# Patient Record
Sex: Male | Born: 1965
Health system: Southern US, Community
[De-identification: ages and names within clinical notes are randomized; demographics above are authoritative.]

## PROBLEM LIST (undated history)

## (undated) DIAGNOSIS — H269 Unspecified cataract: Secondary | ICD-10-CM

## (undated) HISTORY — DX: Unspecified cataract: H26.9

---

## 2009-05-29 ENCOUNTER — Inpatient Hospital Stay (HOSPITAL_COMMUNITY): Admission: EM | Admit: 2009-05-29 | Discharge: 2009-05-30 | Payer: Self-pay | Admitting: Emergency Medicine

## 2009-06-11 ENCOUNTER — Encounter: Admission: RE | Admit: 2009-06-11 | Discharge: 2009-06-11 | Payer: Self-pay | Admitting: Orthopedic Surgery

## 2010-08-30 IMAGING — CT CT CERVICAL SPINE W/O CM
2 of 8 series · 6 of 20 positions shown, 7 images · non-contrast
Comparison: None.

CT HEAD

CLINICAL DATA: Fell down stairs.  Laceration to the right side of
the forehead.

CT HEAD WITHOUT CONTRAST
CT CERVICAL SPINE WITHOUT CONTRAST
TECHNIQUE: Multidetector CT imaging of the head and cervical spine
was performed following the standard protocol without intravenous
contrast.  Multiplanar CT image reconstructions of the cervical
spine were also generated.

[Series 106: reformatted · coronal · 0.43mm/px · 3 of 55 slices shown (1 of 2)]
[im 13/55  bone]
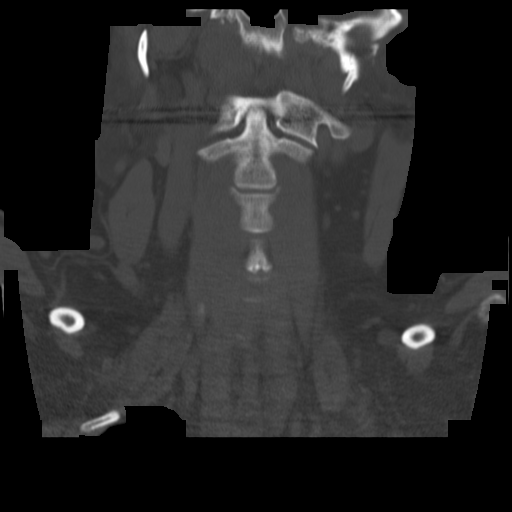
[im 23/55  bone]
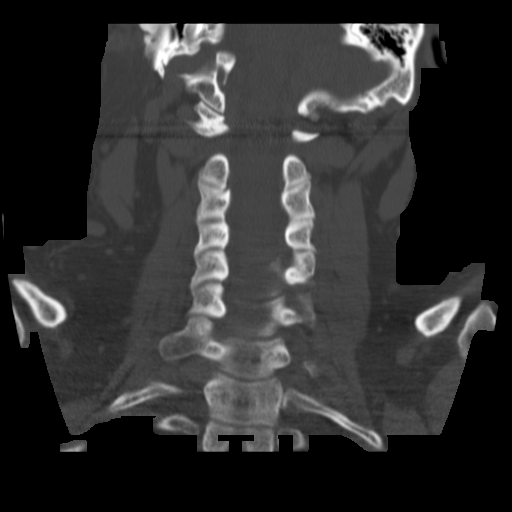
[im 32/55  bone]
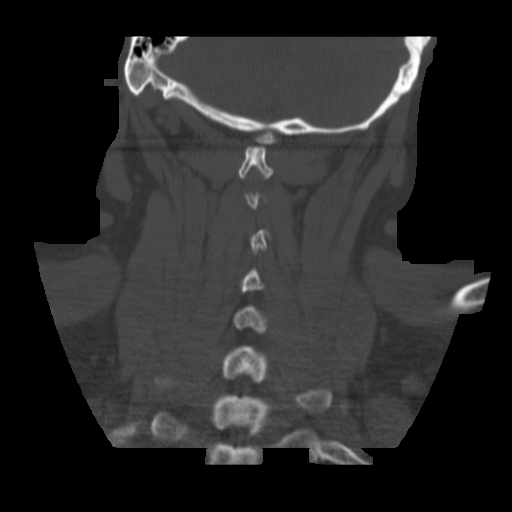

[Series 107: reformatted · axial · 0.43mm/px · z∈[-358,-156]mm · 3 of 82 slices shown, 4 images (2 of 2)]
[im 1/82  soft-tissue]
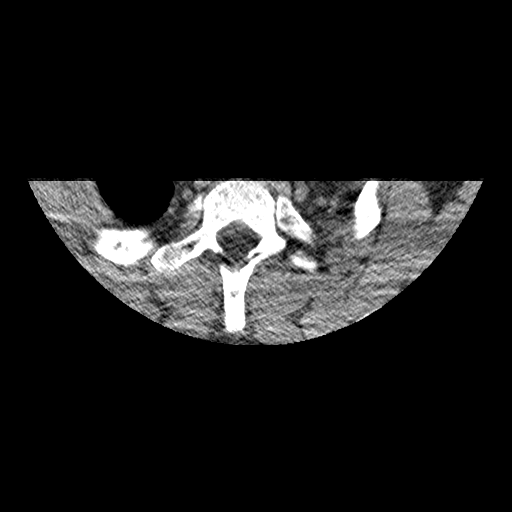
[im 1/82  bone]
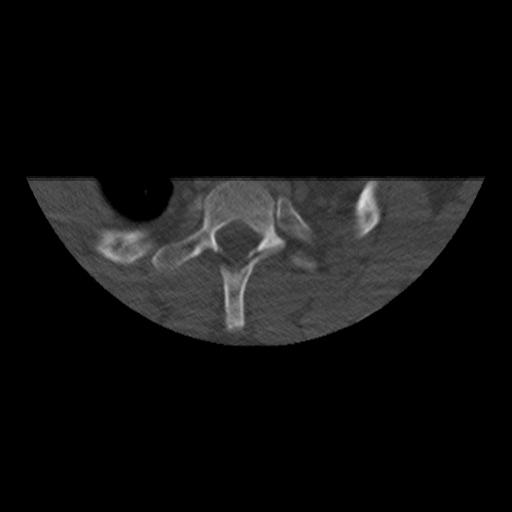
[im 41/82  bone]
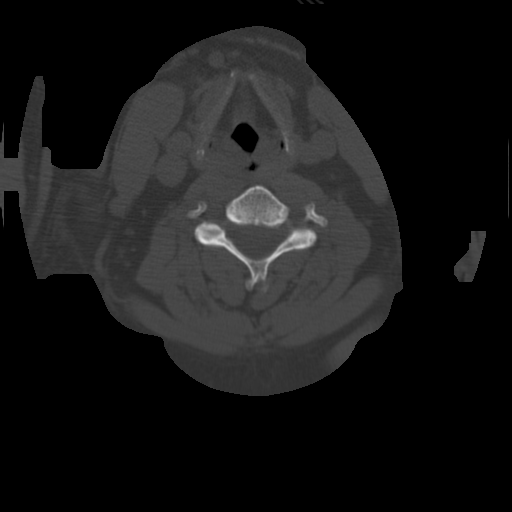
[im 82/82  bone]
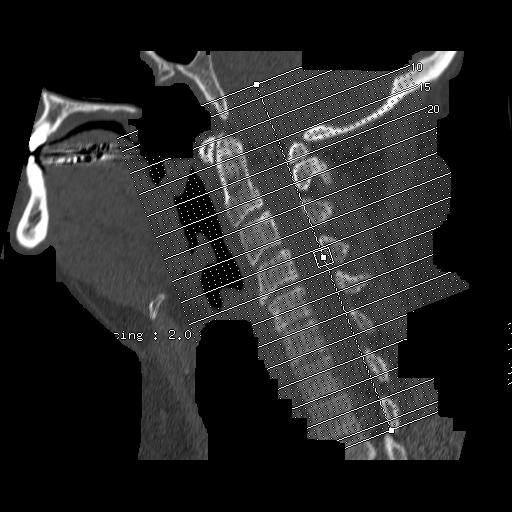

[6 of 20 positions shown; findings below may reference images not displayed]

FINDINGS: Head tilt in the gantry accounts for apparent asymmetry
in the cerebral hemispheres.  Ventricular system normal in size and
appearance for age.  No mass lesion.  No midline shift.  No acute
hemorrhage or hematoma.  No extra-axial fluid collection.  No
evidence of acute infarction.  No focal brain parenchymal
abnormality.

Right frontal scalp hematoma and laceration without underlying
skull fracture.  Visualized paranasal sinuses, mastoid air cells,
and middle ear cavities well-aerated.
IMPRESSION: 1.  Normal intracranially.
2.  Right frontal scalp hematoma without underlying skull fracture.

CT CERVICAL SPINE
FINDINGS: Nondisplaced fracture involving the left lateral mass of
C1.  Sagittal reconstructed images demonstrate anatomic posterior
alignment.  Disc space narrowing and endplate hypertrophic changes
are present at C4-5 and C5-6, mild in degree.  Facet joints intact
throughout.  No spinal stenosis.  Coronal reformatted images
demonstrate intact craniocervical junction, intact C1-C2
accumulation, intact dens, and intact lateral masses.  Right
uncinate hypertrophy accounts for mild right bony foraminal
stenosis at C2-3.  Mild bilateral uncinate hypertrophy accounts for
moderate right and mild left foraminal stenosis at C5-6.
IMPRESSION: 1.  Nondisplaced fracture involving the left lateral mass of C1.
2.  No fractures elsewhere in the cervical spine.
3.  Mild disc space narrowing at C4-5 and C5-6.

Results were telephoned to Dr. Erxleben in the [HOSPITAL]
the time of interpretation on 05/29/2009 at 2132 hours.

## 2010-10-13 LAB — CBC
Hemoglobin: 16.6 g/dL (ref 13.0–17.0)
Platelets: 291 10*3/uL (ref 150–400)
RDW: 13.4 % (ref 11.5–15.5)
WBC: 9.5 10*3/uL (ref 4.0–10.5)

## 2010-10-13 LAB — DIFFERENTIAL
Basophils Absolute: 0.1 10*3/uL (ref 0.0–0.1)
Lymphocytes Relative: 28 % (ref 12–46)
Neutro Abs: 6 10*3/uL (ref 1.7–7.7)

## 2010-10-13 LAB — BASIC METABOLIC PANEL
BUN: 8 mg/dL (ref 6–23)
Calcium: 8.6 mg/dL (ref 8.4–10.5)
GFR calc non Af Amer: >60 mL/min (ref >60)
Glucose, Bld: 156 mg/dL — ABNORMAL HIGH (ref 70–99)
Sodium: 140 mEq/L (ref 135–145)

## 2010-10-13 LAB — ETHANOL: Alcohol, Ethyl (B): 213 mg/dL — ABNORMAL HIGH (ref 0–10)

## 2016-12-28 DIAGNOSIS — R1032 Left lower quadrant pain: Secondary | ICD-10-CM | POA: Diagnosis not present

## 2017-01-05 DIAGNOSIS — Z1211 Encounter for screening for malignant neoplasm of colon: Secondary | ICD-10-CM | POA: Diagnosis not present

## 2017-01-05 DIAGNOSIS — Z01818 Encounter for other preprocedural examination: Secondary | ICD-10-CM | POA: Diagnosis not present

## 2017-01-06 DIAGNOSIS — R740 Nonspecific elevation of levels of transaminase and lactic acid dehydrogenase [LDH]: Secondary | ICD-10-CM | POA: Diagnosis not present

## 2017-01-09 ENCOUNTER — Other Ambulatory Visit: Payer: Self-pay | Admitting: Family Medicine

## 2017-01-09 DIAGNOSIS — R945 Abnormal results of liver function studies: Principal | ICD-10-CM

## 2017-01-09 DIAGNOSIS — R7989 Other specified abnormal findings of blood chemistry: Secondary | ICD-10-CM

## 2017-01-13 DIAGNOSIS — R945 Abnormal results of liver function studies: Secondary | ICD-10-CM | POA: Diagnosis not present

## 2017-01-27 DIAGNOSIS — R7989 Other specified abnormal findings of blood chemistry: Secondary | ICD-10-CM | POA: Diagnosis not present

## 2017-01-27 DIAGNOSIS — R945 Abnormal results of liver function studies: Secondary | ICD-10-CM | POA: Diagnosis not present

## 2017-01-27 DIAGNOSIS — R748 Abnormal levels of other serum enzymes: Secondary | ICD-10-CM | POA: Diagnosis not present

## 2017-02-01 DIAGNOSIS — Z1211 Encounter for screening for malignant neoplasm of colon: Secondary | ICD-10-CM | POA: Diagnosis not present

## 2017-02-01 DIAGNOSIS — D126 Benign neoplasm of colon, unspecified: Secondary | ICD-10-CM | POA: Diagnosis not present

## 2017-02-01 DIAGNOSIS — K635 Polyp of colon: Secondary | ICD-10-CM | POA: Diagnosis not present

## 2017-02-02 DIAGNOSIS — Z1211 Encounter for screening for malignant neoplasm of colon: Secondary | ICD-10-CM | POA: Diagnosis not present

## 2017-02-02 DIAGNOSIS — K635 Polyp of colon: Secondary | ICD-10-CM | POA: Diagnosis not present

## 2017-02-02 DIAGNOSIS — D126 Benign neoplasm of colon, unspecified: Secondary | ICD-10-CM | POA: Diagnosis not present

## 2017-02-06 ENCOUNTER — Ambulatory Visit
Admission: RE | Admit: 2017-02-06 | Discharge: 2017-02-06 | Disposition: A | Discharge status: Home / Self Care | Payer: BLUE CROSS/BLUE SHIELD | Source: Ambulatory Visit | Attending: Family Medicine | Admitting: Family Medicine

## 2017-02-06 DIAGNOSIS — R945 Abnormal results of liver function studies: Secondary | ICD-10-CM | POA: Diagnosis not present

## 2017-02-06 DIAGNOSIS — R7989 Other specified abnormal findings of blood chemistry: Secondary | ICD-10-CM

## 2017-03-31 DIAGNOSIS — E785 Hyperlipidemia, unspecified: Secondary | ICD-10-CM | POA: Diagnosis not present

## 2017-03-31 DIAGNOSIS — Z23 Encounter for immunization: Secondary | ICD-10-CM | POA: Diagnosis not present

## 2017-03-31 DIAGNOSIS — Z720 Tobacco use: Secondary | ICD-10-CM | POA: Diagnosis not present

## 2017-03-31 DIAGNOSIS — Z125 Encounter for screening for malignant neoplasm of prostate: Secondary | ICD-10-CM | POA: Diagnosis not present

## 2017-03-31 DIAGNOSIS — Z0000 Encounter for general adult medical examination without abnormal findings: Secondary | ICD-10-CM | POA: Diagnosis not present

## 2017-06-10 DIAGNOSIS — J069 Acute upper respiratory infection, unspecified: Secondary | ICD-10-CM | POA: Diagnosis not present

## 2017-08-27 DIAGNOSIS — R69 Illness, unspecified: Secondary | ICD-10-CM | POA: Diagnosis not present

## 2017-12-20 IMAGING — US US ABDOMEN COMPLETE
1 series · 13 of 25 positions shown · non-contrast
Comparison: None.

CLINICAL DATA: 51-year-old male with elevated liver function
studies. Left abdominal pain for 2 weeks. Initial encounter.

EXAM:
ABDOMEN ULTRASOUND COMPLETE

[Series 1: us abdomen complete · 0.15mm/px · 13 of 86 slices shown]
[im 1/86]
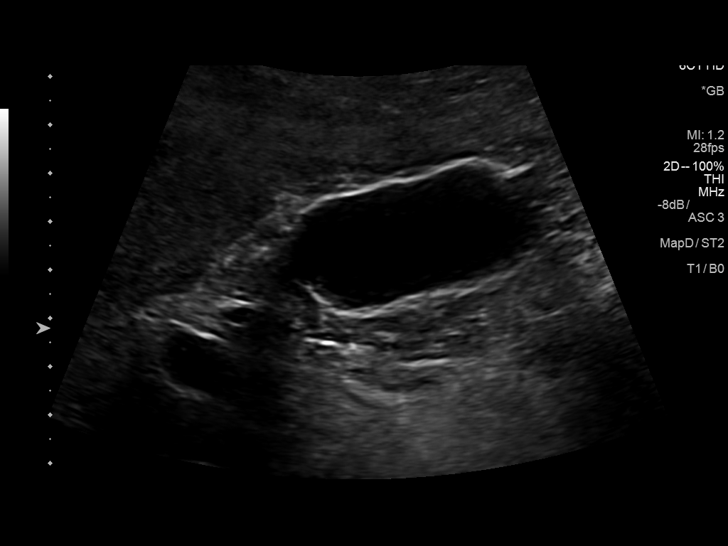
[im 8/86]
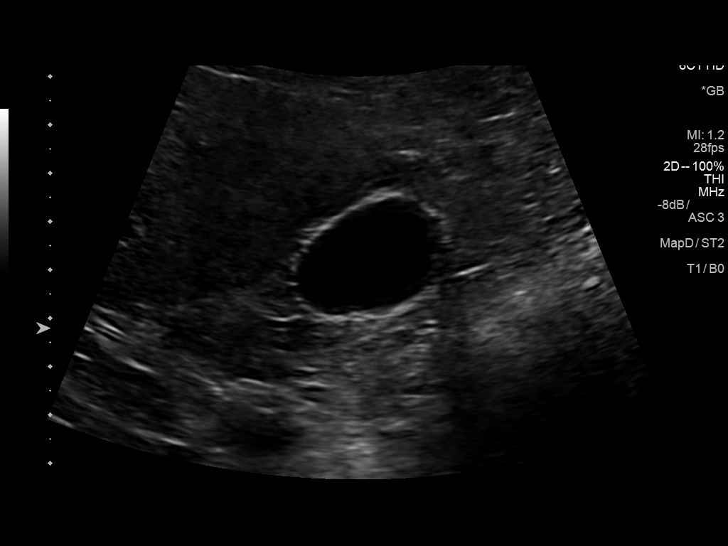
[im 15/86]
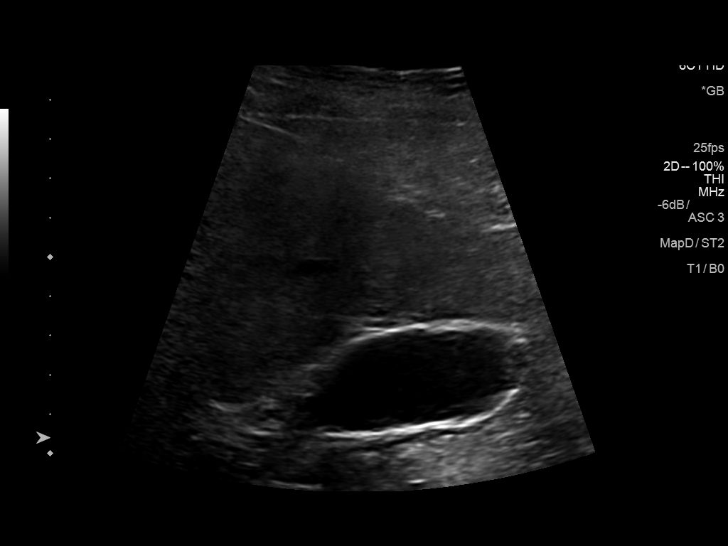
[im 22/86]
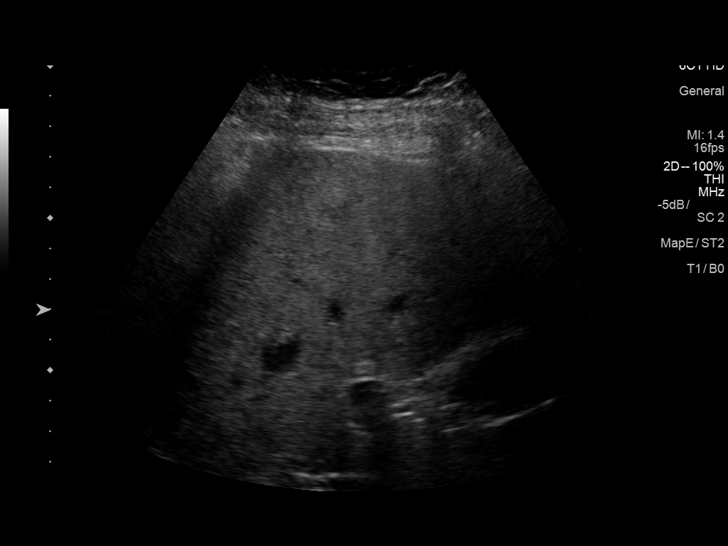
[im 29/86]
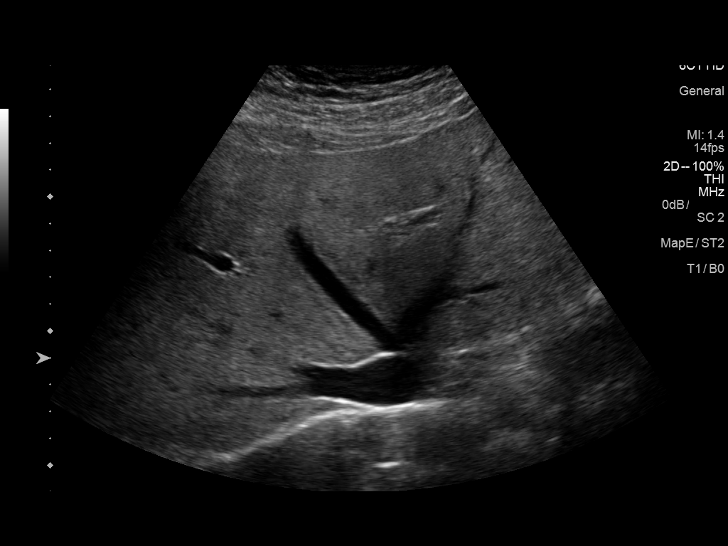
[im 36/86]
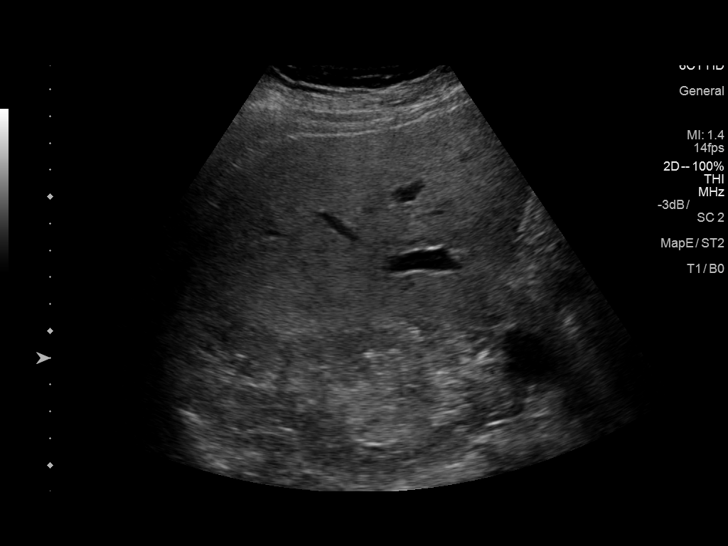
[im 43/86]
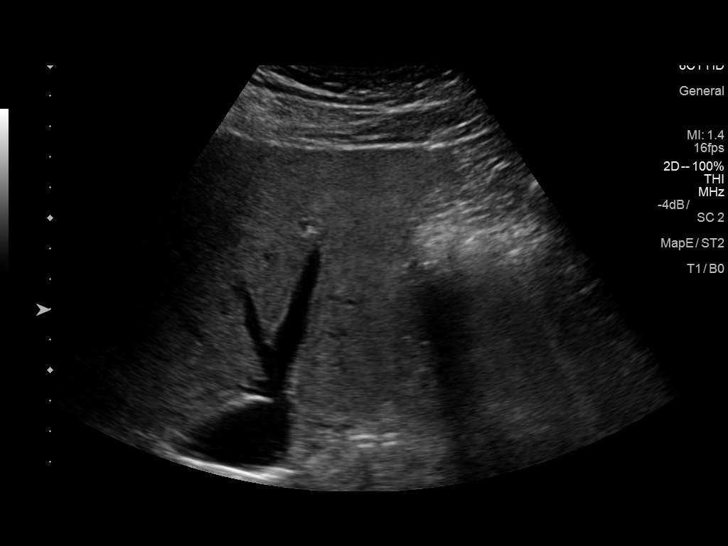
[im 50/86]
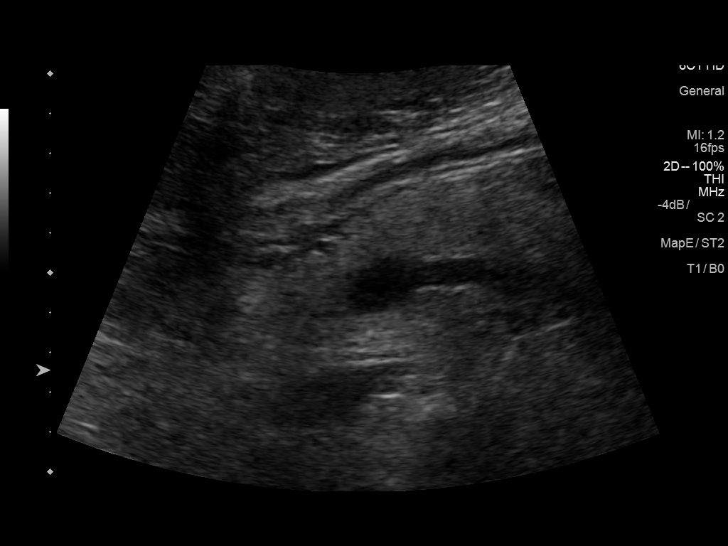
[im 57/86]
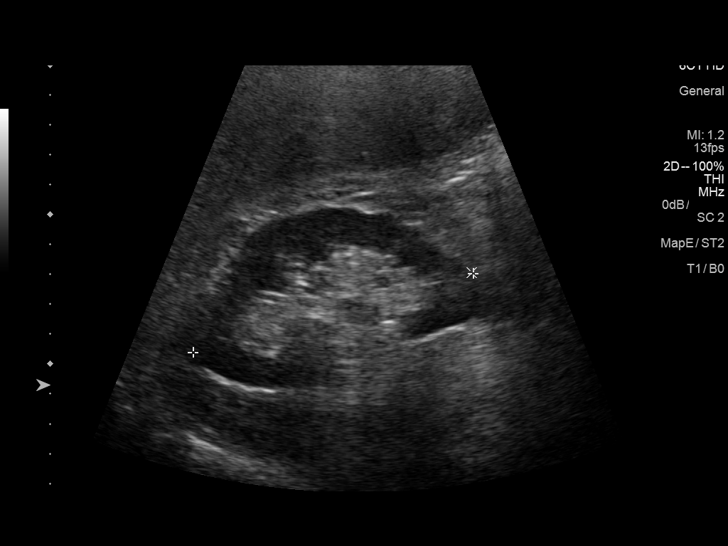
[im 64/86]
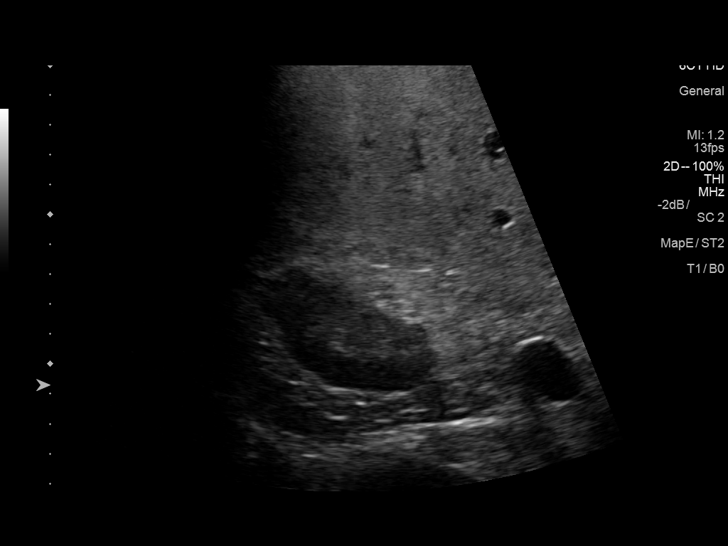
[im 71/86]
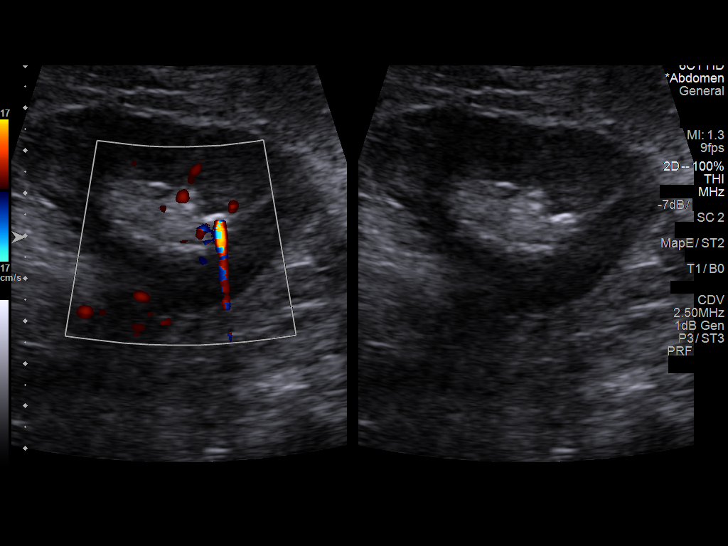
[im 78/86]
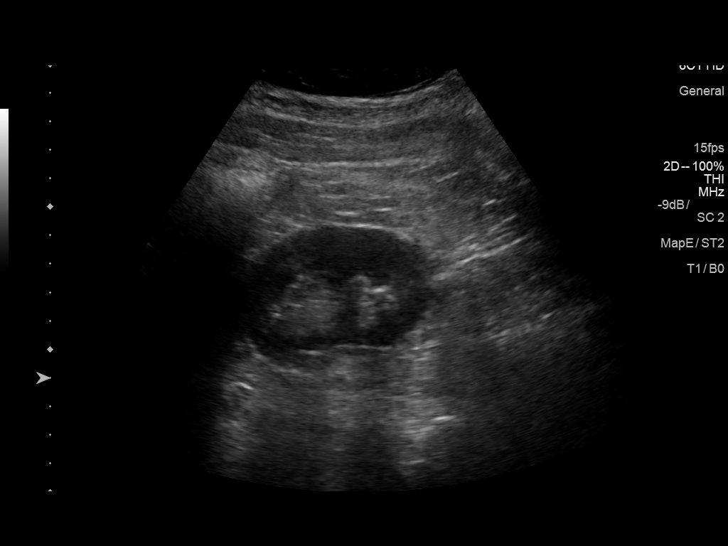
[im 86/86]
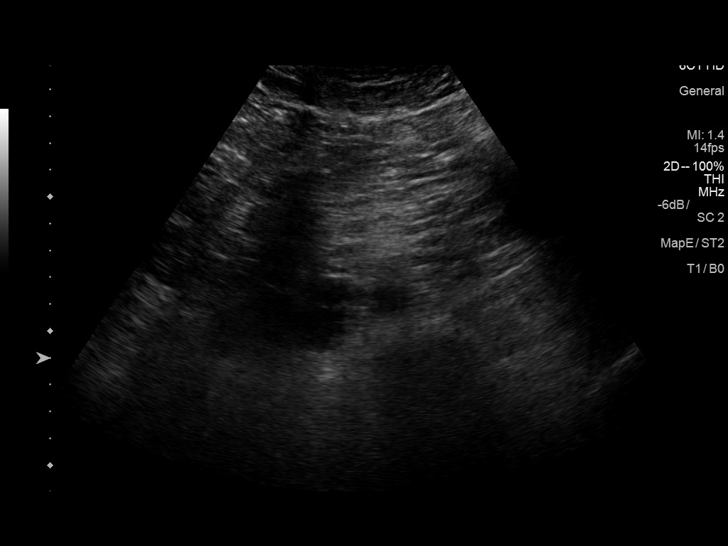

[13 of 25 positions shown; findings below may reference images not displayed]

FINDINGS: Gallbladder: No gallstones or wall thickening visualized. No
sonographic Murphy sign noted by sonographer.

Common bile duct: Diameter: 2.2 mm

Liver: Heterogeneous and increased echogenicity suggestive of fatty
infiltration. Result of fibrosis less likely consideration. No
dominant mass or intrahepatic biliary duct dilatation. Hepatopetal
flow within the main portal vein.

IVC: No abnormality visualized.

Pancreas: Suboptimally evaluated secondary to habitus and bowel gas.
Portions visualized unremarkable.

Spleen: Size and appearance within normal limits.

Right Kidney: Length: 10.7 cm.  No hydronephrosis or mass.

Left Kidney: Length: 12 cm. No hydronephrosis. Nonobstructing 5.3 mm
lower pole stone.

Abdominal aorta: Not well visualized secondary to bowel gas.

Other findings: None.
IMPRESSION: Gallbladder unremarkable.

Fatty liver suspected without focal hepatic lesion noted.

5.3 mm nonobstructing left lower pole renal calculus.

Pancreas and majority of aorta poorly delineated secondary to bowel
gas.

## 2018-03-10 DIAGNOSIS — B349 Viral infection, unspecified: Secondary | ICD-10-CM | POA: Diagnosis not present

## 2018-04-04 DIAGNOSIS — Z125 Encounter for screening for malignant neoplasm of prostate: Secondary | ICD-10-CM | POA: Diagnosis not present

## 2018-04-04 DIAGNOSIS — Z0000 Encounter for general adult medical examination without abnormal findings: Secondary | ICD-10-CM | POA: Diagnosis not present

## 2018-04-04 DIAGNOSIS — Z23 Encounter for immunization: Secondary | ICD-10-CM | POA: Diagnosis not present

## 2018-04-04 DIAGNOSIS — E785 Hyperlipidemia, unspecified: Secondary | ICD-10-CM | POA: Diagnosis not present

## 2019-02-21 DIAGNOSIS — H43391 Other vitreous opacities, right eye: Secondary | ICD-10-CM | POA: Diagnosis not present

## 2019-02-22 ENCOUNTER — Encounter (INDEPENDENT_AMBULATORY_CARE_PROVIDER_SITE_OTHER): Payer: Self-pay | Admitting: Ophthalmology

## 2019-02-22 ENCOUNTER — Other Ambulatory Visit: Payer: Self-pay

## 2019-02-22 ENCOUNTER — Ambulatory Visit (INDEPENDENT_AMBULATORY_CARE_PROVIDER_SITE_OTHER): Payer: BC Managed Care – PPO | Admitting: Ophthalmology

## 2019-02-22 DIAGNOSIS — H3581 Retinal edema: Secondary | ICD-10-CM | POA: Diagnosis not present

## 2019-02-22 DIAGNOSIS — H43811 Vitreous degeneration, right eye: Secondary | ICD-10-CM

## 2019-02-22 DIAGNOSIS — H25813 Combined forms of age-related cataract, bilateral: Secondary | ICD-10-CM

## 2019-02-22 DIAGNOSIS — H2513 Age-related nuclear cataract, bilateral: Secondary | ICD-10-CM | POA: Diagnosis not present

## 2019-02-22 DIAGNOSIS — H52203 Unspecified astigmatism, bilateral: Secondary | ICD-10-CM

## 2019-02-22 DIAGNOSIS — H4311 Vitreous hemorrhage, right eye: Secondary | ICD-10-CM

## 2019-02-22 DIAGNOSIS — H5213 Myopia, bilateral: Secondary | ICD-10-CM

## 2019-02-22 NOTE — Progress Notes (Signed)
[facility] - Clinic Note  02/22/2019     CHIEF COMPLAINT Patient presents for Retina Evaluation   HISTORY OF PRESENT ILLNESS: Jonathan Yang is a 53 y.o. male who presents to the clinic today for:   HPI    Retina Evaluation    In right eye.  This started days ago.  Duration of days.  Associated Symptoms Floaters and Flashes.  Context:  distance vision.  I, the attending physician,  performed the HPI with the patient and updated documentation appropriately.          Comments    New patient retina eval Patient states he was boxing with son and got hit in right eye a few days ago.  Patient states eye became swollen and bruised a couple of days later and then noticed a large floater and flashes of light OD.  Patient has noticed a decrease in his vision OD since this occurrence.       Last edited by Rennis ChrisZamora, Marli Diego, MD on 02/22/2019 11:19 AM. (History)    pt states Wednesday he started seeing floaters and flashes in his vision and he went to his family dr who referred [facility] where he saw Dr. 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 [place] DeMarco for concern of vitreous hemorrhage / retinal tear OD LEE: 08.14.20 (J. DeMarco) Ocular  Hx- PMH-   CURRENT MEDICATIONS: No current outpatient medications on file. (Ophthalmic Drugs)   No current facility-administered medications for this visit.  (Ophthalmic Drugs)   No current outpatient medications on file. (Other)   No current facility-administered medications for this visit.  (Other)      REVIEW OF SYSTEMS: ROS    Positive for: Eyes   Negative for: Constitutional, Gastrointestinal, Neurological, Skin, Genitourinary, Musculoskeletal, HENT, Endocrine, Cardiovascular, Respiratory, Psychiatric, Allergic/Imm, Heme/Lymph   Last edited by Corrinne EagleEnglish, Ashley L on 02/22/2019 10:24 AM. (History)       ALLERGIES Not on File  PAST MEDICAL HISTORY History reviewed. No pertinent past medical history. History reviewed. No pertinent surgical history.  FAMILY HISTORY History reviewed. No pertinent family history.  SOCIAL HISTORY Social History   Tobacco Use  . Smoking status: Not on file  Substance Use Topics  . Alcohol use: Not on file  . Drug use: Not on file         OPHTHALMIC EXAM:  Base Eye Exam    Visual Acuity (Snellen - Linear)      Right Left   Dist cc 20/50 +2 20/30 -1   Dist ph cc 20/30 -2 20/20 -2   Correction: Glasses       Tonometry (Tonopen, 10:35 AM)  Right Left   Pressure 21 22       Pupils      Dark   Right Dilated   Left Dilated       Visual Fields      Left Right    Full Full       Extraocular Movement      Right Left    Full Full       Neuro/Psych    Oriented x3: Yes   Mood/Affect: Normal       Dilation    Both eyes: 1.0% Mydriacyl, 2.5% Phenylephrine @ 10:36 AM  Dilated upon arrival        Slit Lamp and Fundus Exam    Slit Lamp Exam      Right Left   Lids/Lashes Dermatochalasis - upper lid, Meibomian gland dysfunction Dermatochalasis - upper lid, Meibomian gland dysfunction   Conjunctiva/Sclera White and quiet White and quiet   Cornea trace Arcus, trace Punctate epithelial erosions trace Arcus, trace  Punctate epithelial erosions   Anterior Chamber Deep and quiet Deep and quiet   Iris Round and dilated Round and dilated   Lens 2+ Nuclear sclerosis, 2+ Cortical cataract 2+ Nuclear sclerosis, 2+ Cortical cataract   Vitreous Vitreous syneresis, +mild RBC, blood stained Posterior vitreous detachment and vitreous condensations, VH and pre-retinal heme settling inferiorly  Vitreous syneresis       Fundus Exam      Right Left   Disc Pink and Sharp Pink and Sharp   C/D Ratio 0.2 0.2   Macula Flat, Blunted foveal reflex, Retinal pigment epithelial mottling, No heme or edema Flat, Blunted foveal reflex, mild Retinal pigment epithelial mottling, No heme or edema   Vessels Vascular attenuation, Tortuous Vascular attenuation, Tortuous   Periphery Attached, VH and pre-retinal heme settling inferiorly, mild peripheral cystoid degeneration at ora; no RT/RD on 360 scleral depression Attached, mild pigmented lattice at 0700         Refraction    Wearing Rx      Sphere Cylinder Axis Add   Right -4.25 +1.50 165 +2.50   Left -6.50 +3.00 090 +2.50   Type: prog       Manifest Refraction      Sphere Cylinder Axis Dist VA   Right -4.00 +1.50 155 20/30-3   Left -5.75 +3.00 090 20/20-2          IMAGING AND PROCEDURES  Imaging and Procedures for @TODAY @  OCT, Retina - OU - Both Eyes       Right Eye Quality was good. Central Foveal Thickness: 281. Progression has no prior data. Findings include normal foveal contour, no SRF, no IRF (Vitreous opacities).   Left Eye Quality was good. Central Foveal Thickness: 279. Progression has no prior data. Findings include normal foveal contour, no IRF, no SRF, vitreomacular adhesion .   Notes *Images captured and stored on drive  Diagnosis / Impression:  NFP, no IRF/SRF OU Vitreous opacities OD  Clinical management:  See below  Abbreviations: NFP - Normal foveal profile. CME - cystoid macular edema. PED - pigment epithelial detachment. IRF -  intraretinal fluid. SRF - subretinal fluid. EZ - ellipsoid zone. ERM - epiretinal membrane. ORA - outer retinal atrophy. ORT - outer retinal tubulation. SRHM - subretinal hyper-reflective material                 ASSESSMENT/PLAN:    ICD-10-CM   1. Posterior vitreous detachment of right eye  H43.811   2. Vitreous hemorrhage of  right eye (HCC)  H43.11   3. Retinal edema  H35.81 OCT, Retina - OU - Both Eyes  4. Combined forms of age-related cataract of both eyes  H25.813   5. Myopia of both eyes with astigmatism  H52.13    H52.203     1,2. Hemorrhagic PVD OD  - acutely symptomatic central floaters, onset Wednesday 8.12.20  - pt spoke to PCP on 08.13.20 who set up consult w/ Dr. Alver FisherJ. DeMarco on 08.14.20 (earlier today)  - pt reports boxing trauma to OD ~2 wks ago  - Discussed findings and prognosis  - No RT or RD on 360 scleral depressed exam  - Reviewed s/s of RT/RD  - Strict return precautions for any such RT/RD signs/symptoms  - VH precautions reviewed -- minimize activities, keep head elevated, avoid ASA/NSAIDs/blood thinners as able  - F/U 10 days, sooner prn for DFE/OCT  3. No retinal edema on exam or OCT  4. Mixed form age related cataract OU  - The symptoms of cataract, surgical options, and treatments and risks were discussed with patient.  - discussed diagnosis and progression  - not yet visually significant  - monitor for now  5. History of Myopia with Astigmatism    Ophthalmic Meds Ordered this visit:  No orders of the defined types were placed in this encounter.      Return in about 10 days (around 03/04/2019) for f/u hemorrhagic PVD OD, DFE, OCT, OPTOS (colors).  There are no Patient Instructions on file for this visit.   Explained the diagnoses, plan, and follow up with the patient and they expressed understanding.  Patient expressed understanding of the importance of proper follow up care.   This document serves as a record of services personally  performed by Karie ChimeraBrian G. Efrata Brunner, MD, PhD. It was created on their behalf by Laurian BrimAmanda Brown, OA, an ophthalmic assistant. The creation of this record is the provider's dictation and/or activities during the visit.    Electronically signed by: Laurian BrimAmanda Brown, OA  08.14.2020 1:05 PM    Karie ChimeraBrian G. Neema Barreira, M.D., Ph.D. Diseases & Surgery of the Retina and Vitreous [facility]   I have reviewed the above documentation for accuracy and completeness, and I agree with the above. Karie ChimeraBrian G. Braylinn Gulden, M.D., Ph.D. 02/22/19 1:05 PM     Abbreviations: M myopia (nearsighted); A astigmatism; H hyperopia (farsighted); P presbyopia; Mrx spectacle prescription;  CTL contact lenses; OD right eye; OS left eye; OU both eyes  XT exotropia; ET esotropia; PEK punctate epithelial keratitis; PEE punctate epithelial erosions; DES dry eye syndrome; MGD meibomian gland dysfunction; ATs artificial tears; PFAT's preservative free artificial tears; NSC nuclear sclerotic cataract; PSC posterior subcapsular cataract; ERM epi-retinal membrane; PVD posterior vitreous detachment; RD retinal detachment; DM diabetes mellitus; DR diabetic retinopathy; NPDR non-proliferative diabetic retinopathy; PDR proliferative diabetic retinopathy; CSME clinically significant macular edema; DME diabetic macular edema; dbh dot blot hemorrhages; CWS cotton wool spot; POAG primary open angle glaucoma; C/D cup-to-disc ratio; HVF humphrey visual field; GVF goldmann visual field; OCT optical coherence tomography; IOP intraocular pressure; BRVO Branch retinal vein occlusion; CRVO central retinal vein occlusion; CRAO central retinal artery occlusion; BRAO branch retinal artery occlusion; RT retinal tear; SB scleral buckle; PPV pars plana vitrectomy; VH Vitreous hemorrhage; PRP panretinal laser photocoagulation; IVK intravitreal kenalog; VMT vitreomacular traction; MH Macular hole;  NVD neovascularization of the disc; NVE neovascularization elsewhere;  AREDS age related eye disease study; ARMD age related macular degeneration; POAG primary open angle glaucoma;  EBMD epithelial/anterior basement membrane dystrophy; ACIOL anterior chamber intraocular lens; IOL intraocular lens; PCIOL posterior chamber intraocular lens; Phaco/IOL phacoemulsification with intraocular lens placement; [place] photorefractive keratectomy; LASIK laser assisted in situ keratomileusis; HTN hypertension; DM diabetes mellitus; COPD chronic obstructive pulmonary disease

## 2019-02-28 NOTE — Progress Notes (Signed)
[facility] - Clinic Note  03/04/2019     CHIEF COMPLAINT Patient presents for Retina Follow Up   HISTORY OF PRESENT ILLNESS: Jonathan Yang is a 53 y.o. male who presents to the clinic today for:   HPI    Retina Follow Up    Patient presents with  PVD.  In right eye.  This started weeks ago.  Severity is moderate.  Duration of weeks.  Since onset it is gradually improving.  I, the attending physician,  performed the HPI with the patient and updated documentation appropriately.          Comments    Patient states his vision is improving OU.  Patient denies eye pain or discomfort.  Patient states he still has persistent floaters OU and an occasional flash of light.       Last edited by Rennis ChrisZamora, Indea Dearman, MD on 03/04/2019  9:38 AM. (History)    pt states he feels like his vision is getting better, but he still sees floaters and his vision gets cloudy occasionally, he states if he blinks he can clear it up  Referring physician: Demarco, [place], OD [address] [place]  [place] [id number]  HISTORICAL INFORMATION:   Selected notes from the MEDICAL RECORD NUMBER Referred by Dr. [place] DeMarco for concern of vitreous hemorrhage / retinal tear OD LEE: 08.14.20 (J. DeMarco) Ocular Hx- PMH-   CURRENT MEDICATIONS: No current outpatient medications on file. (Ophthalmic Drugs)   No current facility-administered medications for this visit.  (Ophthalmic Drugs)   No current outpatient medications on file. (Other)   No current facility-administered medications for this visit.  (Other)      REVIEW OF SYSTEMS: ROS    Positive for: Eyes   Negative for: Constitutional, Gastrointestinal, Neurological, Skin, Genitourinary, Musculoskeletal, HENT, Endocrine, Cardiovascular, Respiratory, Psychiatric, Allergic/Imm, Heme/Lymph   Last edited by Corrinne EagleEnglish, Ashley L on 03/04/2019  9:11 AM. (History)       ALLERGIES Not on File  PAST MEDICAL HISTORY History reviewed. No  pertinent past medical history. History reviewed. No pertinent surgical history.  FAMILY HISTORY History reviewed. No pertinent family history.  SOCIAL HISTORY Social History   Tobacco Use  . Smoking status: Not on file  Substance Use Topics  . Alcohol use: Not on file  . Drug use: Not on file         OPHTHALMIC EXAM:  Base Eye Exam    Visual Acuity (Snellen - Linear)      Right Left   Dist cc 20/20 -2 20/20 -2   Correction: Glasses       Tonometry (Tonopen, 9:13 AM)      Right Left   Pressure 18 19       Pupils      Dark Light Shape React APD   Right 3 2 Round Brisk 0   Left 3 2 Round Brisk 0       Visual Fields      Left Right    Full Full       Extraocular Movement      Right Left    Full Full       Neuro/Psych    Oriented x3: Yes   Mood/Affect: Normal       Dilation    Both eyes: 1.0% Mydriacyl, 2.5% Phenylephrine @ 9:13 AM        Slit Lamp and Fundus Exam    Slit Lamp Exam      Right Left  Lids/Lashes Dermatochalasis - upper lid, Meibomian gland dysfunction Dermatochalasis - upper lid, Meibomian gland dysfunction   Conjunctiva/Sclera White and quiet White and quiet   Cornea trace Arcus, trace Punctate epithelial erosions trace Arcus, trace Punctate epithelial erosions   Anterior Chamber Deep and quiet Deep and quiet   Iris Round and dilated Round and dilated   Lens 2+ Nuclear sclerosis, 2+ Cortical cataract 2+ Nuclear sclerosis, 2+ Cortical cataract   Vitreous Vitreous syneresis, +mild RBC, blood stained Posterior vitreous detachment and vitreous condensations, VH and pre-retinal heme clearing centrally and settling inferiorly, blood clots inferiorly Vitreous syneresis       Fundus Exam      Right Left   Disc Pink and Sharp Pink and Sharp   C/D Ratio 0.2 0.2   Macula Flat, Blunted foveal reflex, Retinal pigment epithelial mottling, No heme or edema Flat, Blunted foveal reflex, mild Retinal pigment epithelial mottling, No heme or edema    Vessels Vascular attenuation, Tortuous Vascular attenuation, Tortuous   Periphery Attached, VH and pre-retinal heme settling inferiorly, mild peripheral cystoid degeneration at ora; no RT/RD on 360 scleral depression Attached, mild pigmented lattice at 0700         Refraction    Wearing Rx      Sphere Cylinder Axis Add   Right -4.25 +1.50 165 +2.50   Left -6.50 +3.00 090 +2.50   Type: prog          IMAGING AND PROCEDURES  Imaging and Procedures for @TODAY @  OCT, Retina - OU - Both Eyes       Right Eye Quality was good. Central Foveal Thickness: 278. Progression has improved. Findings include normal foveal contour, no SRF, no IRF (Mild interval improvement in central Vitreous opacities -- settling inferiorly).   Left Eye Quality was good. Central Foveal Thickness: 276. Progression has no prior data. Findings include normal foveal contour, no IRF, no SRF, vitreomacular adhesion .   Notes *Images captured and stored on drive  Diagnosis / Impression:  NFP, no IRF/SRF OU  OD: Mild interval improvement in central Vitreous opacities -- settling inferiorly  Clinical management:  See below  Abbreviations: NFP - Normal foveal profile. CME - cystoid macular edema. PED - pigment epithelial detachment. IRF - intraretinal fluid. SRF - subretinal fluid. EZ - ellipsoid zone. ERM - epiretinal membrane. ORA - outer retinal atrophy. ORT - outer retinal tubulation. SRHM - subretinal hyper-reflective material                 ASSESSMENT/PLAN:    ICD-10-CM   1. Posterior vitreous detachment of right eye  H43.811   2. Vitreous hemorrhage of right eye (HCC)  H43.11   3. Retinal edema  H35.81 OCT, Retina - OU - Both Eyes  4. Combined forms of age-related cataract of both eyes  H25.813   5. Myopia of both eyes with astigmatism  H52.13    H52.203     1,2. Hemorrhagic PVD OD  - acutely symptomatic central floaters, onset Wednesday 8.12.20  - pt spoke to PCP on 08.13.20 who set up  consult w/ Dr. Alver FisherJ. DeMarco on 08.14.20  - pt reports boxing trauma to OD ~2 wks prior to initial onset of symptoms  - No RT or RD on 360 scleral depressed exam  - today, VH clearing centrally and settling inferiorly  - Reviewed s/s of RT/RD  - Strict return precautions for any such RT/RD signs/symptoms  - VH precautions reviewed -- minimize activities, keep head elevated, avoid ASA/NSAIDs/blood thinners  as able  - F/U 2 weeks, sooner prn for DFE/OCT  3.  No retinal edema on exam or OCT  4. Mixed form age related cataract OU  - The symptoms of cataract, surgical options, and treatments and risks were discussed with patient.  - discussed diagnosis and progression  - not yet visually significant  - monitor for now  5. History of Myopia with Astigmatism   Ophthalmic Meds Ordered this visit:  No orders of the defined types were placed in this encounter.      Return in about 16 days (around 03/20/2019) for f/u hemorrhagic PVD OD, DFE, OCT.  There are no Patient Instructions on file for this visit.   Explained the diagnoses, plan, and follow up with the patient and they expressed understanding.  Patient expressed understanding of the importance of proper follow up care.   This document serves as a record of services personally performed by Gardiner Sleeper, MD, PhD. It was created on their behalf by Roselee Nova, COMT. The creation of this record is the provider's dictation and/or activities during the visit.  Electronically signed by: Roselee Nova, COMT 03/04/19 12:40 PM   Gardiner Sleeper, M.D., Ph.D. Diseases & Surgery of the Retina and Vitreous [facility]  I have reviewed the above documentation for accuracy and completeness, and I agree with the above. Gardiner Sleeper, M.D., Ph.D. 03/04/19 12:40 PM    Abbreviations: M myopia (nearsighted); A astigmatism; H hyperopia (farsighted); P presbyopia; Mrx spectacle prescription;  CTL contact lenses; OD right  eye; OS left eye; OU both eyes  XT exotropia; ET esotropia; PEK punctate epithelial keratitis; PEE punctate epithelial erosions; DES dry eye syndrome; MGD meibomian gland dysfunction; ATs artificial tears; PFAT's preservative free artificial tears; [place] nuclear sclerotic cataract; PSC posterior subcapsular cataract; ERM epi-retinal membrane; PVD posterior vitreous detachment; RD retinal detachment; DM diabetes mellitus; DR diabetic retinopathy; NPDR non-proliferative diabetic retinopathy; PDR proliferative diabetic retinopathy; CSME clinically significant macular edema; DME diabetic macular edema; dbh dot blot hemorrhages; CWS cotton wool spot; POAG primary open angle glaucoma; C/D cup-to-disc ratio; HVF humphrey visual field; GVF goldmann visual field; OCT optical coherence tomography; IOP intraocular pressure; BRVO Branch retinal vein occlusion; CRVO central retinal vein occlusion; CRAO central retinal artery occlusion; BRAO branch retinal artery occlusion; RT retinal tear; SB scleral buckle; PPV pars plana vitrectomy; VH Vitreous hemorrhage; PRP panretinal laser photocoagulation; IVK intravitreal kenalog; VMT vitreomacular traction; MH Macular hole;  NVD neovascularization of the disc; NVE neovascularization elsewhere; AREDS age related eye disease study; ARMD age related macular degeneration; POAG primary open angle glaucoma; EBMD epithelial/anterior basement membrane dystrophy; ACIOL anterior chamber intraocular lens; IOL intraocular lens; PCIOL posterior chamber intraocular lens; Phaco/IOL phacoemulsification with intraocular lens placement; [place] photorefractive keratectomy; LASIK laser assisted in situ keratomileusis; HTN hypertension; DM diabetes mellitus; COPD chronic obstructive pulmonary disease

## 2019-03-04 ENCOUNTER — Encounter (INDEPENDENT_AMBULATORY_CARE_PROVIDER_SITE_OTHER): Payer: Self-pay | Admitting: Ophthalmology

## 2019-03-04 ENCOUNTER — Other Ambulatory Visit: Payer: Self-pay

## 2019-03-04 ENCOUNTER — Ambulatory Visit (INDEPENDENT_AMBULATORY_CARE_PROVIDER_SITE_OTHER): Payer: BC Managed Care – PPO | Admitting: Ophthalmology

## 2019-03-04 DIAGNOSIS — H25813 Combined forms of age-related cataract, bilateral: Secondary | ICD-10-CM

## 2019-03-04 DIAGNOSIS — H4311 Vitreous hemorrhage, right eye: Secondary | ICD-10-CM | POA: Diagnosis not present

## 2019-03-04 DIAGNOSIS — H43811 Vitreous degeneration, right eye: Secondary | ICD-10-CM | POA: Diagnosis not present

## 2019-03-04 DIAGNOSIS — H3581 Retinal edema: Secondary | ICD-10-CM

## 2019-03-04 DIAGNOSIS — H52203 Unspecified astigmatism, bilateral: Secondary | ICD-10-CM

## 2019-03-04 DIAGNOSIS — H5213 Myopia, bilateral: Secondary | ICD-10-CM

## 2019-03-20 ENCOUNTER — Encounter (INDEPENDENT_AMBULATORY_CARE_PROVIDER_SITE_OTHER): Payer: Self-pay | Admitting: Ophthalmology

## 2019-03-20 ENCOUNTER — Other Ambulatory Visit: Payer: Self-pay

## 2019-03-20 ENCOUNTER — Ambulatory Visit (INDEPENDENT_AMBULATORY_CARE_PROVIDER_SITE_OTHER): Payer: BC Managed Care – PPO | Admitting: Ophthalmology

## 2019-03-20 DIAGNOSIS — H3581 Retinal edema: Secondary | ICD-10-CM

## 2019-03-20 DIAGNOSIS — H25813 Combined forms of age-related cataract, bilateral: Secondary | ICD-10-CM | POA: Diagnosis not present

## 2019-03-20 DIAGNOSIS — H52203 Unspecified astigmatism, bilateral: Secondary | ICD-10-CM

## 2019-03-20 DIAGNOSIS — H4311 Vitreous hemorrhage, right eye: Secondary | ICD-10-CM | POA: Diagnosis not present

## 2019-03-20 DIAGNOSIS — H5213 Myopia, bilateral: Secondary | ICD-10-CM

## 2019-03-20 DIAGNOSIS — H43811 Vitreous degeneration, right eye: Secondary | ICD-10-CM | POA: Diagnosis not present

## 2019-03-20 NOTE — Progress Notes (Signed)
[facility] - Clinic Note  03/20/2019     CHIEF COMPLAINT Patient presents for Retina Follow Up   HISTORY OF PRESENT ILLNESS: Jonathan Yang is a 53 y.o. male who presents to the clinic today for:   HPI    Retina Follow Up    Patient presents with  PVD.  In right eye.  This started 4 weeks ago.  Severity is mild.  Duration of 2 weeks.  Since onset it is stable.  I, the attending physician,  performed the HPI with the patient and updated documentation appropriately.          Comments    53 y/o male pt here for 2 wk f/u for hemorrhagic pvd OD.  [place].  Denies pain,flashes, but still has a few large floaters OD.  No gtts.       Last edited by Rennis ChrisZamora, Egon Dittus, MD on 03/20/2019 10:54 PM. (History)    VA good OU.  Still has a few large floaters OD, though they are slowly clearing.  Pt reports occasional eye strain due to being back at work looking at [employer] screens.  Flashes have mostly stopped.  Referring physician: Farris HasMorrow, Aaron, MD [address] [place] [id number]  HISTORICAL INFORMATION:   Selected notes from the MEDICAL RECORD NUMBER Referred by Dr. [place] DeMarco for concern of vitreous hemorrhage / retinal tear OD LEE: 08.14.20 (J. DeMarco) Ocular Hx- PMH-   CURRENT MEDICATIONS: No current outpatient medications on file. (Ophthalmic Drugs)   No current facility-administered medications for this visit.  (Ophthalmic Drugs)   No current outpatient medications on file. (Other)   No current facility-administered medications for this visit.  (Other)      REVIEW OF SYSTEMS: ROS    Positive for: Eyes   Negative for: Constitutional, Gastrointestinal, Neurological, Skin, Genitourinary, Musculoskeletal, HENT, Endocrine, Cardiovascular, Respiratory, Psychiatric, Allergic/Imm, Heme/Lymph   Last edited by Celine MansBaxley, Andrew G, COA on 03/20/2019  2:44 PM. (History)       ALLERGIES No Known Allergies  PAST MEDICAL  HISTORY Past Medical History:  Diagnosis Date  . Cataract    OU   History reviewed. No pertinent surgical history.  FAMILY HISTORY History reviewed. No pertinent family history.  SOCIAL HISTORY Social History   Tobacco Use  . Smoking status: Not on file  Substance Use Topics  . Alcohol use: Not on file  . Drug use: Not on file         OPHTHALMIC EXAM:  Base Eye Exam    Visual Acuity (Snellen - Linear)      Right Left   Dist cc 20/20 -2 20/20 -   Correction: Glasses       Tonometry (Tonopen, 2:46 PM)      Right Left   Pressure 15 16       Pupils      Dark Light Shape React APD   Right 3 2 Round Brisk None   Left 3 2 Round Brisk None       Visual Fields (Counting fingers)      Left Right    Full Full       Extraocular Movement      Right Left    Full, Ortho Full, Ortho       Neuro/Psych    Oriented x3: Yes   Mood/Affect: Normal       Dilation    Both eyes: 1.0% Mydriacyl, 2.5% Phenylephrine @ 2:46 PM  Slit Lamp and Fundus Exam    Slit Lamp Exam      Right Left   Lids/Lashes Dermatochalasis - upper lid, Meibomian gland dysfunction Dermatochalasis - upper lid, Meibomian gland dysfunction   Conjunctiva/Sclera White and quiet White and quiet   Cornea trace Arcus, trace Punctate epithelial erosions trace Arcus, trace Punctate epithelial erosions   Anterior Chamber Deep and quiet Deep and quiet   Iris Round and dilated Round and dilated   Lens 2+ Nuclear sclerosis, 2+ Cortical cataract 2+ Nuclear sclerosis, 2+ Cortical cataract   Vitreous Vitreous syneresis, +mild RBC, blood stained Posterior vitreous detachment and vitreous condensations, VH and pre-retinal heme clearing centrally and settling inferiorly, blood clots inferiorly slightly improved Vitreous syneresis       Fundus Exam      Right Left   Disc Pink and Sharp Pink and Sharp   C/D Ratio 0.2 0.2   Macula Flat, Blunted foveal reflex, Retinal pigment epithelial mottling, No heme or  edema Flat, Blunted foveal reflex, mild Retinal pigment epithelial mottling, No heme or edema   Vessels Vascular attenuation, Tortuous Vascular attenuation, Tortuous   Periphery Attached, VH and pre-retinal heme settling inferiorly, mild peripheral cystoid degeneration at ora; no RT/RD Attached, mild pigmented lattice at 0700           IMAGING AND PROCEDURES  Imaging and Procedures for @TODAY @  OCT, Retina - OU - Both Eyes       Right Eye Quality was good. Central Foveal Thickness: 283. Progression has improved. Findings include normal foveal contour, no SRF, no IRF (Mild interval improvement in central Vitreous opacities -- settling inferiorly).   Left Eye Quality was good. Central Foveal Thickness: 280. Progression has been stable. Findings include normal foveal contour, no IRF, no SRF, vitreomacular adhesion .   Notes *Images captured and stored on drive  Diagnosis / Impression:  NFP, no IRF/SRF OU  OD: Mild interval improvement in central Vitreous opacities -- settling inferiorly  Clinical management:  See below  Abbreviations: NFP - Normal foveal profile. CME - cystoid macular edema. PED - pigment epithelial detachment. IRF - intraretinal fluid. SRF - subretinal fluid. EZ - ellipsoid zone. ERM - epiretinal membrane. ORA - outer retinal atrophy. ORT - outer retinal tubulation. SRHM - subretinal hyper-reflective material                 ASSESSMENT/PLAN:    ICD-10-CM   1. Posterior vitreous detachment of right eye  H43.811   2. Vitreous hemorrhage of right eye (HCC)  H43.11   3. Retinal edema  H35.81 OCT, Retina - OU - Both Eyes  4. Combined forms of age-related cataract of both eyes  H25.813   5. Myopia of both eyes with astigmatism  H52.13    H52.203     1,2. Hemorrhagic PVD OD  - acutely symptomatic central floaters, onset Wednesday 8.12.20  - pt spoke to PCP on 08.13.20 who set up consult w/ Dr. Alver Fisher on 08.14.20  - pt reports boxing trauma to OD ~2  wks prior to initial onset of symptoms  - No RT or RD on 360 scleral depressed exam  - today, VH clearing centrally and settling inferiorly  - Reviewed s/s of RT/RD  - Strict return precautions for any such RT/RD signs/symptoms  - VH precautions reviewed -- minimize activities, keep head elevated, avoid ASA/NSAIDs/blood thinners as able  - F/U 3 weeks, sooner prn for DFE/OCT  3.  No retinal edema on exam or OCT  4.  Mixed form age related cataract OU  - The symptoms of cataract, surgical options, and treatments and risks were discussed with patient.  - discussed diagnosis and progression  - not yet visually significant  - monitor for now  5. History of Myopia with Astigmatism   Ophthalmic Meds Ordered this visit:  No orders of the defined types were placed in this encounter.      Return in about 3 weeks (around 04/10/2019) for DFE, OCT.  There are no Patient Instructions on file for this visit.   Explained the diagnoses, plan, and follow up with the patient and they expressed understanding.  Patient expressed understanding of the importance of proper follow up care.   This document serves as a record of services personally performed by Gardiner Sleeper, MD, PhD. It was created on their behalf by Ernest Mallick, OA, an ophthalmic assistant. The creation of this record is the provider's dictation and/or activities during the visit.    Electronically signed by: Ernest Mallick, OA  09.09.2020 10:55 PM    Gardiner Sleeper, M.D., Ph.D. Diseases & Surgery of the Retina and Vitreous [facility]  I have reviewed the above documentation for accuracy and completeness, and I agree with the above. Gardiner Sleeper, M.D., Ph.D. 03/20/19 10:56 PM     Abbreviations: M myopia (nearsighted); A astigmatism; H hyperopia (farsighted); P presbyopia; Mrx spectacle prescription;  CTL contact lenses; OD right eye; OS left eye; OU both eyes  XT exotropia; ET esotropia; PEK punctate  epithelial keratitis; PEE punctate epithelial erosions; DES dry eye syndrome; MGD meibomian gland dysfunction; ATs artificial tears; PFAT's preservative free artificial tears; [place] nuclear sclerotic cataract; PSC posterior subcapsular cataract; ERM epi-retinal membrane; PVD posterior vitreous detachment; RD retinal detachment; DM diabetes mellitus; DR diabetic retinopathy; NPDR non-proliferative diabetic retinopathy; PDR proliferative diabetic retinopathy; CSME clinically significant macular edema; DME diabetic macular edema; dbh dot blot hemorrhages; CWS cotton wool spot; POAG primary open angle glaucoma; C/D cup-to-disc ratio; HVF humphrey visual field; GVF goldmann visual field; OCT optical coherence tomography; IOP intraocular pressure; BRVO Branch retinal vein occlusion; CRVO central retinal vein occlusion; CRAO central retinal artery occlusion; BRAO branch retinal artery occlusion; RT retinal tear; SB scleral buckle; PPV pars plana vitrectomy; VH Vitreous hemorrhage; PRP panretinal laser photocoagulation; IVK intravitreal kenalog; VMT vitreomacular traction; MH Macular hole;  NVD neovascularization of the disc; NVE neovascularization elsewhere; AREDS age related eye disease study; ARMD age related macular degeneration; POAG primary open angle glaucoma; EBMD epithelial/anterior basement membrane dystrophy; ACIOL anterior chamber intraocular lens; IOL intraocular lens; PCIOL posterior chamber intraocular lens; Phaco/IOL phacoemulsification with intraocular lens placement; [place] photorefractive keratectomy; LASIK laser assisted in situ keratomileusis; HTN hypertension; DM diabetes mellitus; COPD chronic obstructive pulmonary disease

## 2019-04-10 NOTE — Progress Notes (Signed)
[place] Clinic Note  04/12/2019     CHIEF COMPLAINT Patient presents for Retina Follow Up   HISTORY OF PRESENT ILLNESS: Jonathan Yang is a 53 y.o. male who presents to the clinic today for:   HPI    Retina Follow Up    Patient presents with  PVD.  In right eye.  This started 2 months ago.  Severity is mild.  Duration of 3 weeks.  Since onset it is gradually improving.  I, the attending physician,  performed the HPI with the patient and updated documentation appropriately.          Comments    53 y/o male pt here for 3 wk f/u for PVD OD.  VA OD seems a bit clearer.  Floaters OD are still there, but seem to be settling some.  No changes OS.  Denies pain, flashes.  No gtts.       Last edited by Bernarda Caffey, MD on 04/12/2019  1:06 PM. (History)    pt states he feels like his floaters are getting better and are not in his central vision as much anymore   Referring physician: Dem[place], [place], [place],   [id number]  HISTORICAL INFORMATION:   Selected notes from the [place] Referred by Dr. [place] DeMarco for concern of vitreous hemorrhage / retinal tear OD LEE: 08.14.20 (J. DeMarco) Ocular Hx- PMH-   CURRENT MEDICATIONS: No current outpatient medications on file. (Ophthalmic Drugs)   No current facility-administered medications for this visit.  (Ophthalmic Drugs)   No current outpatient medications on file. (Other)   No current facility-administered medications for this visit.  (Other)      REVIEW OF SYSTEMS: ROS    Positive for: Eyes   Negative for: Constitutional, Gastrointestinal, Neurological, Skin, Genitourinary, Musculoskeletal, HENT, Endocrine, Cardiovascular, Respiratory, Psychiatric, Allergic/Imm, Heme/Lymph   Last edited by Matthew Folks, COA on 04/12/2019  1:01 PM. (History)       ALLERGIES No Known Allergies  PAST MEDICAL HISTORY Past Medical History:  Diagnosis Date  . Cataract     OU   History reviewed. No pertinent surgical history.  FAMILY HISTORY History reviewed. No pertinent family history.  SOCIAL HISTORY Social History   Tobacco Use  . Smoking status: Not on file  Substance Use Topics  . Alcohol use: Not on file  . Drug use: Not on file         OPHTHALMIC EXAM:  Base Eye Exam    Visual Acuity (Snellen - Linear)      Right Left   Dist cc 20/20 20/20   Correction: Glasses       Tonometry (Tonopen, 1:02 PM)      Right Left   Pressure 13 17       Pupils      Dark Light Shape React APD   Right 3 2 Round Brisk None   Left 3 2 Round Brisk None       Visual Fields (Counting fingers)      Left Right    Full Full       Extraocular Movement      Right Left    Full, Ortho Full, Ortho       Neuro/Psych    Oriented x3: Yes   Mood/Affect: Normal       Dilation    Both eyes: 1.0% Mydriacyl, 2.5% Phenylephrine @ 1:02 PM        Slit Lamp and Fundus  Exam    Slit Lamp Exam      Right Left   Lids/Lashes Dermatochalasis - upper lid, Meibomian gland dysfunction Dermatochalasis - upper lid, Meibomian gland dysfunction   Conjunctiva/Sclera White and quiet White and quiet   Cornea trace Arcus, trace Punctate epithelial erosions trace Arcus, trace Punctate epithelial erosions   Anterior Chamber Deep and quiet Deep and quiet   Iris Round and dilated Round and dilated   Lens 2+ Nuclear sclerosis, 2+ Cortical cataract 2+ Nuclear sclerosis, 2+ Cortical cataract   Vitreous Vitreous syneresis, VH clearing centrally and settling inferiorly, prominent blood stained vit condensations floating over IT arcades Vitreous syneresis       Fundus Exam      Right Left   Disc Pink and Sharp Pink and Sharp   C/D Ratio 0.2 0.2   Macula Flat, Blunted foveal reflex, Retinal pigment epithelial mottling, No heme or edema Flat, Blunted foveal reflex, mild Retinal pigment epithelial mottling, No heme or edema   Vessels Vascular attenuation, Tortuous Vascular  attenuation, Tortuous   Periphery Attached, VH and pre-retinal heme settling inferiorly, mild peripheral cystoid degeneration at ora; no RT/RD Attached, mild pigmented lattice at 0700           IMAGING AND PROCEDURES  Imaging and Procedures for @TODAY @  OCT, Retina - OU - Both Eyes       Right Eye Quality was good. Central Foveal Thickness: 285. Progression has improved. Findings include normal foveal contour, no SRF, no IRF (interval improvement in Vitreous opacities -- settling inferiorly).   Left Eye Quality was good. Central Foveal Thickness: 276. Progression has been stable. Findings include normal foveal contour, no IRF, no SRF, vitreomacular adhesion .   Notes *Images captured and stored on drive  Diagnosis / Impression:  NFP, no IRF/SRF OU  OD: Mild interval improvement in Vitreous opacities -- settling inferiorly  Clinical management:  See below  Abbreviations: NFP - Normal foveal profile. CME - cystoid macular edema. PED - pigment epithelial detachment. IRF - intraretinal fluid. SRF - subretinal fluid. EZ - ellipsoid zone. ERM - epiretinal membrane. ORA - outer retinal atrophy. ORT - outer retinal tubulation. SRHM - subretinal hyper-reflective material                 ASSESSMENT/PLAN:    ICD-10-CM   1. Posterior vitreous detachment of right eye  H43.811   2. Vitreous hemorrhage of right eye (HCC)  H43.11   3. Retinal edema  H35.81 OCT, Retina - OU - Both Eyes  4. Combined forms of age-related cataract of both eyes  H25.813   5. Myopia of both eyes with astigmatism  H52.13    H52.203     1,2. Hemorrhagic PVD OD  - acutely symptomatic central floaters, onset Wednesday 8.12.20  - pt spoke to PCP on 08.13.20 who set up consult w/ Dr. 08.15.20 on 08.14.20  - pt reports boxing trauma to OD ~2 wks prior to initial onset of symptoms  - No RT or RD on 360 scleral exam  - today, VH clearing further centrally and settling inferiorly  - Reviewed s/s of  RT/RD  - Strict return precautions for any such RT/RD signs/symptoms  - VH precautions reviewed -- minimize activities, keep head elevated, avoid ASA/NSAIDs/blood thinners as able  - F/U 4-6 weeks, sooner prn for DFE/OCT  3.  No retinal edema on exam or OCT  4. Mixed form age related cataract OU  - The symptoms of cataract, surgical options, and treatments  and risks were discussed with patient.  - discussed diagnosis and progression  - not yet visually significant  - monitor for now  5. History of Myopia with Astigmatism   Ophthalmic Meds Ordered this visit:  No orders of the defined types were placed in this encounter.      Return for f/u 4-6 weeks, PVD OD, DFE, OCT.  There are no Patient Instructions on file for this visit.   Explained the diagnoses, plan, and follow up with the patient and they expressed understanding.  Patient expressed understanding of the importance of proper follow up care.   This document serves as a record of services personally performed by Karie ChimeraBrian G. Jacquel Redditt, MD, PhD. It was created on their behalf by Annalee Gentaaryl Barber, COMT. The creation of this record is the provider's dictation and/or activities during the visit.  Electronically signed by: Annalee Gentaaryl Barber, COMT 04/13/19 8:52 PM   Karie ChimeraBrian G. Shaan Rhoads, M.D., Ph.D. Diseases & Surgery of the Retina and Vitreous [facility]   I have reviewed the above documentation for accuracy and completeness, and I agree with the above. Karie ChimeraBrian G. Fumio Vandam, M.D., Ph.D. 04/13/19 8:52 PM    Abbreviations: M myopia (nearsighted); A astigmatism; H hyperopia (farsighted); P presbyopia; Mrx spectacle prescription;  CTL contact lenses; OD right eye; OS left eye; OU both eyes  XT exotropia; ET esotropia; PEK punctate epithelial keratitis; PEE punctate epithelial erosions; DES dry eye syndrome; MGD meibomian gland dysfunction; ATs artificial tears; PFAT's preservative free artificial tears; [place] nuclear sclerotic  cataract; PSC posterior subcapsular cataract; ERM epi-retinal membrane; PVD posterior vitreous detachment; RD retinal detachment; DM diabetes mellitus; DR diabetic retinopathy; NPDR non-proliferative diabetic retinopathy; PDR proliferative diabetic retinopathy; CSME clinically significant macular edema; DME diabetic macular edema; dbh dot blot hemorrhages; CWS cotton wool spot; POAG primary open angle glaucoma; C/D cup-to-disc ratio; HVF humphrey visual field; GVF goldmann visual field; OCT optical coherence tomography; IOP intraocular pressure; BRVO Branch retinal vein occlusion; CRVO central retinal vein occlusion; CRAO central retinal artery occlusion; BRAO branch retinal artery occlusion; RT retinal tear; SB scleral buckle; PPV pars plana vitrectomy; VH Vitreous hemorrhage; PRP panretinal laser photocoagulation; IVK intravitreal kenalog; VMT vitreomacular traction; MH Macular hole;  NVD neovascularization of the disc; NVE neovascularization elsewhere; AREDS age related eye disease study; ARMD age related macular degeneration; POAG primary open angle glaucoma; EBMD epithelial/anterior basement membrane dystrophy; ACIOL anterior chamber intraocular lens; IOL intraocular lens; PCIOL posterior chamber intraocular lens; Phaco/IOL phacoemulsification with intraocular lens placement; PRK photorefractive keratectomy; LASIK laser assisted in situ keratomileusis; HTN hypertension; DM diabetes mellitus; COPD chronic obstructive pulmonary disease

## 2019-04-12 ENCOUNTER — Ambulatory Visit (INDEPENDENT_AMBULATORY_CARE_PROVIDER_SITE_OTHER): Payer: BC Managed Care – PPO | Admitting: Ophthalmology

## 2019-04-12 ENCOUNTER — Other Ambulatory Visit: Payer: Self-pay

## 2019-04-12 ENCOUNTER — Encounter (INDEPENDENT_AMBULATORY_CARE_PROVIDER_SITE_OTHER): Payer: Self-pay | Admitting: Ophthalmology

## 2019-04-12 DIAGNOSIS — H43811 Vitreous degeneration, right eye: Secondary | ICD-10-CM | POA: Diagnosis not present

## 2019-04-12 DIAGNOSIS — H3581 Retinal edema: Secondary | ICD-10-CM | POA: Diagnosis not present

## 2019-04-12 DIAGNOSIS — H25813 Combined forms of age-related cataract, bilateral: Secondary | ICD-10-CM | POA: Diagnosis not present

## 2019-04-12 DIAGNOSIS — H5213 Myopia, bilateral: Secondary | ICD-10-CM

## 2019-04-12 DIAGNOSIS — H4311 Vitreous hemorrhage, right eye: Secondary | ICD-10-CM

## 2019-04-12 DIAGNOSIS — H52203 Unspecified astigmatism, bilateral: Secondary | ICD-10-CM

## 2019-04-13 ENCOUNTER — Encounter (INDEPENDENT_AMBULATORY_CARE_PROVIDER_SITE_OTHER): Payer: Self-pay | Admitting: Ophthalmology

## 2019-05-24 ENCOUNTER — Encounter (INDEPENDENT_AMBULATORY_CARE_PROVIDER_SITE_OTHER): Payer: BC Managed Care – PPO | Admitting: Ophthalmology

## 2019-06-27 NOTE — Progress Notes (Addendum)
[facility] - Clinic Note  06/28/2019     CHIEF COMPLAINT Patient presents for Blurred Vision   HISTORY OF PRESENT ILLNESS: Jonathan Yang is a 53 y.o. male who presents to the clinic today for:   HPI    Blurred Vision    In right eye.  Onset was sudden.  Vision is blurred, hazy and with a spot.  Severity is severe.  This started 2 days ago.  Occurring constantly.  It is worse throughout the day.  Context:  distance vision, mid-range vision and near vision.  Since onset it is stable.  Treatments tried include no treatments.  I, the attending physician,  performed the HPI with the patient and updated documentation appropriately.          Comments    53 y/o male pt c/o sudden onset extremely blurred vision w/floaters OD.  Symptoms sudden onset 2 days ago while working in his shed.  No obvious cause.  No heavy lifting or exertion.  Symptoms have been constant since.  Denies pain, flashes, but reports vision OD is like "looking through jelly," with a "large, reddish" floater centrally and lots of small black floaters as well.  No problems reported OS.  Last saw pt 04/12/19 for f/u for hemorrhagic PVD OD which had resolved.  No gtts.       Last edited by Rennis ChrisZamora, Jeronica Stlouis, MD on 06/28/2019  9:09 AM. (History)    pt states 3 days ago he was in his shed moving boxes, the next day he woke up and felt like he had blood in his eye and he was unable to see through it, he states he slept with his head elevated and it seemed to get a little better, but today he cannot see through it at all, he states he can see a large, red floater centrally, pt denies history of blood pressure problems, he takes medication for gout as needed, he denies recreational drug use, pt denies seeing flashing lights   Referring physician: Demarco, [place], OD [address] [place] [id number]  HISTORICAL INFORMATION:   Selected notes from the MEDICAL RECORD NUMBER Referred by Dr. [place] DeMarco  for concern of vitreous hemorrhage / retinal tear OD LEE: 08.14.20 (J. DeMarco) Ocular Hx- PMH-   CURRENT MEDICATIONS: Current Outpatient Medications (Ophthalmic Drugs)  Medication Sig  . prednisoLONE acetate (PRED FORTE) 1 % ophthalmic suspension Place 1 drop into the right eye 4 (four) times daily for 7 days.   No current facility-administered medications for this visit. (Ophthalmic Drugs)   No current outpatient medications on file. (Other)   No current facility-administered medications for this visit. (Other)      REVIEW OF SYSTEMS: ROS    Positive for: Eyes   Negative for: Constitutional, Gastrointestinal, Neurological, Skin, Genitourinary, Musculoskeletal, HENT, Endocrine, Cardiovascular, Respiratory, Psychiatric, Allergic/Imm, Heme/Lymph   Last edited by Celine MansBaxley, Andrew G, COA on 06/28/2019  8:46 AM. (History)       ALLERGIES No Known Allergies  PAST MEDICAL HISTORY Past Medical History:  Diagnosis Date  . Cataract    OU   History reviewed. No pertinent surgical history.  FAMILY HISTORY History reviewed. No pertinent family history.  SOCIAL HISTORY Social History   Tobacco Use  . Smoking status: Not on file  Substance Use Topics  . Alcohol use: Not on file  . Drug use: Not on file         OPHTHALMIC EXAM:  Base Eye Exam  Visual Acuity (Snellen - Linear)      Right Left   Dist cc CF @ 2' 20/20   Dist ph cc NI    Correction: Glasses       Tonometry (Tonopen, 8:50 AM)      Right Left   Pressure 21 18       Pupils      Dark Light Shape React APD   Right 3 2 Round Brisk None   Left 3 2 Round Brisk None       Visual Fields (Counting fingers)      Left Right    Full Full       Extraocular Movement      Right Left    Full, Ortho Full, Ortho       Neuro/Psych    Oriented x3: Yes   Mood/Affect: Normal       Dilation    Both eyes: 1.0% Mydriacyl, 2.5% Phenylephrine @ 8:50 AM        Slit Lamp and Fundus Exam    Slit Lamp  Exam      Right Left   Lids/Lashes Dermatochalasis - upper lid, Meibomian gland dysfunction Dermatochalasis - upper lid, Meibomian gland dysfunction   Conjunctiva/Sclera White and quiet White and quiet   Cornea trace Arcus, trace Punctate epithelial erosions trace Arcus, trace Punctate epithelial erosions   Anterior Chamber Deep and quiet Deep and quiet   Iris Round and dilated Round and dilated   Lens 2+ Nuclear sclerosis, 2+ Cortical cataract 2+ Nuclear sclerosis, 2+ Cortical cataract   Vitreous Vitreous syneresis, blood clots settled inferiorly obscuring inferior retina Vitreous syneresis       Fundus Exam      Right Left   Disc obscured by central VH Pink and Sharp   C/D Ratio 0.2 0.2   Macula Obscured by central VH Flat, Blunted foveal reflex, mild Retinal pigment epithelial mottling, No heme or edema   Vessels Vascular attenuation Vascular attenuation   Periphery Partially obscurd by VH, 80% of periphery is visible and attached, blood clot IT periphery, HST at 1130 Attached, mild pigmented lattice at 0700         Refraction    Manifest Refraction   Unable to improve VA OD w/lenses.          IMAGING AND PROCEDURES  Imaging and Procedures for @  OCT, Retina - OU - Both Eyes       Right Eye Quality was poor. Progression has worsened. Findings include (No image).   Left Eye Quality was good. Central Foveal Thickness: 277. Progression has been stable. Findings include normal foveal contour, no IRF, no SRF, vitreomacular adhesion .   Notes *Images captured and stored on drive  Diagnosis / Impression:  OD: no image OS: NFP, no IRF/SRF   Clinical management:  See below  Abbreviations: NFP - Normal foveal profile. CME - cystoid macular edema. PED - pigment epithelial detachment. IRF - intraretinal fluid. SRF - subretinal fluid. EZ - ellipsoid zone. ERM - epiretinal membrane. ORA - outer retinal atrophy. ORT - outer retinal tubulation. SRHM - subretinal  hyper-reflective material        Repair Retinal Breaks, Cryotherapy - OD - Right Eye       CRYOPEXY PROCEDURE NOTE  Preoperative Diagnosis:       Retinal Tear, RIGHT EYE Postoperative Diagnosis:     Same  Surgeon:              Rennis Chris, M.D./Ph.D. Assistant:  Eldridge Scot, L.P.N. Anesthesia Type:            Subconjunctival lidocaine injection   The patient's RIGHT eye was marked and a timeout was performed to verify the correct patient, the correct site, and correct procedure. One of drop of Proparacaine was given in the operative eye. A local subconjunctival block was performed utilizing 2% Lidocaine, using a 30 gauge needle spanning the 9-3 superior clock hours. A wire eyelid speculum was inserted into the operative eye. Using indirect ophthalmoloscopy, the retinal tear was identified. Cryotherapy was appropriately utilized in the area of retinal tear at 1200. A small amount of Maxitrol ointment was instilled to the operative eye and the eye was patched.  The patient was given a prescription for Pred-Forte to be used QID in the operative eye until the next visit.                 ASSESSMENT/PLAN:    ICD-10-CM   1. Retinal tear of right eye  H33.311 Repair Retinal Breaks, Cryotherapy - OD - Right Eye  2. Vitreous hemorrhage of right eye (HCC)  H43.11   3. Posterior vitreous detachment of right eye  H43.811   4. Retinal edema  H35.81 OCT, Retina - OU - Both Eyes  5. Combined forms of age-related cataract of both eyes  H25.813   6. Myopia of both eyes with astigmatism  H52.13    H52.203    1,2. Retinal tear with new vitreous hemorrhage, right eye.    - The incidence, risk factors, and natural history of retinal tear was discussed with patient.    - Potential treatment options including laser retinopexy and cryotherapy discussed with patient.  - HST located at 1130 with heme emanating from retinal flap tear  - significant central vitreous hemorrhage obscuring  vision  - recommend retinopexy OD today, 12.18.20  - pt wishes to proceed  - RBA of procedure discussed, questions answered  - informed consent obtained and signed  - see procedure note  - of note, attempted laser retinopexy, but could not get good laser burns due to vitreous opacities and hemorrhage -- switched to cryo retinopexy which worked well  - VH precautions reviewed -- minimize activities, keep head elevated, avoid ASA/NSAIDs/blood thinners as able  - start PF QID OD x7 days  - f/u December 23  3. History of Hemorrhagic PVD OD             - acutely symptomatic central floaters, onset Wednesday 8.12.20             - pt spoke to PCP on 08.13.20 who set up consult w/ Dr. Alver Fisher on 08.14.20             - pt reports boxing trauma to OD ~2 wks prior to initial onset of symptoms             - VH precautions reviewed -- minimize activities, keep head elevated, avoid ASA/NSAIDs/blood thinners as able             - F/U December 23 sooner prn for DFE/OCT   4. Mixed form age related cataract OU  - The symptoms of cataract, surgical options, and treatments and risks were discussed with patient.  - discussed diagnosis and progression  - not yet visually significant  - monitor for now  5. History of Myopia with Astigmatism   Ophthalmic Meds Ordered this visit:  Meds ordered this encounter  Medications  . prednisoLONE acetate (PRED FORTE)  1 % ophthalmic suspension    Sig: Place 1 drop into the right eye 4 (four) times daily for 7 days.    Dispense:  10 mL    Refill:  0       Return in about 5 days (around 07/03/2019) for f/u retinal tear OD, DFE, OCT.  There are no Patient Instructions on file for this visit.   Explained the diagnoses, plan, and follow up with the patient and they expressed understanding.  Patient expressed understanding of the importance of proper follow up care.   This document serves as a record of services personally performed by Gardiner Sleeper, MD,  PhD. It was created on their behalf by Ernest Mallick, OA, an ophthalmic assistant. The creation of this record is the provider's dictation and/or activities during the visit.    Electronically signed by: Ernest Mallick, OA 12.17.2020 5:13 PM   Gardiner Sleeper, M.D., Ph.D. Diseases & Surgery of the Retina and Vitreous [facility]   I have reviewed the above documentation for accuracy and completeness, and I agree with the above. Gardiner Sleeper, M.D., Ph.D. 06/28/19 5:13 PM     Abbreviations: M myopia (nearsighted); A astigmatism; H hyperopia (farsighted); P presbyopia; Mrx spectacle prescription;  CTL contact lenses; OD right eye; OS left eye; OU both eyes  XT exotropia; ET esotropia; PEK punctate epithelial keratitis; PEE punctate epithelial erosions; DES dry eye syndrome; MGD meibomian gland dysfunction; ATs artificial tears; PFAT's preservative free artificial tears; [place] nuclear sclerotic cataract; PSC posterior subcapsular cataract; ERM epi-retinal membrane; PVD posterior vitreous detachment; RD retinal detachment; DM diabetes mellitus; DR diabetic retinopathy; NPDR non-proliferative diabetic retinopathy; PDR proliferative diabetic retinopathy; CSME clinically significant macular edema; DME diabetic macular edema; dbh dot blot hemorrhages; CWS cotton wool spot; POAG primary open angle glaucoma; C/D cup-to-disc ratio; HVF humphrey visual field; GVF goldmann visual field; OCT optical coherence tomography; IOP intraocular pressure; BRVO Branch retinal vein occlusion; CRVO central retinal vein occlusion; CRAO central retinal artery occlusion; BRAO branch retinal artery occlusion; RT retinal tear; SB scleral buckle; PPV pars plana vitrectomy; VH Vitreous hemorrhage; PRP panretinal laser photocoagulation; IVK intravitreal kenalog; VMT vitreomacular traction; MH Macular hole;  NVD neovascularization of the disc; NVE neovascularization elsewhere; AREDS age related eye disease study;  ARMD age related macular degeneration; POAG primary open angle glaucoma; EBMD epithelial/anterior basement membrane dystrophy; ACIOL anterior chamber intraocular lens; IOL intraocular lens; PCIOL posterior chamber intraocular lens; Phaco/IOL phacoemulsification with intraocular lens placement; [place] photorefractive keratectomy; LASIK laser assisted in situ keratomileusis; HTN hypertension; DM diabetes mellitus; COPD chronic obstructive pulmonary disease

## 2019-06-28 ENCOUNTER — Other Ambulatory Visit: Payer: Self-pay

## 2019-06-28 ENCOUNTER — Ambulatory Visit (INDEPENDENT_AMBULATORY_CARE_PROVIDER_SITE_OTHER): Payer: BC Managed Care – PPO | Admitting: Ophthalmology

## 2019-06-28 ENCOUNTER — Encounter (INDEPENDENT_AMBULATORY_CARE_PROVIDER_SITE_OTHER): Payer: Self-pay | Admitting: Ophthalmology

## 2019-06-28 DIAGNOSIS — H43811 Vitreous degeneration, right eye: Secondary | ICD-10-CM | POA: Diagnosis not present

## 2019-06-28 DIAGNOSIS — H3581 Retinal edema: Secondary | ICD-10-CM

## 2019-06-28 DIAGNOSIS — H4311 Vitreous hemorrhage, right eye: Secondary | ICD-10-CM | POA: Diagnosis not present

## 2019-06-28 DIAGNOSIS — H5213 Myopia, bilateral: Secondary | ICD-10-CM

## 2019-06-28 DIAGNOSIS — H52203 Unspecified astigmatism, bilateral: Secondary | ICD-10-CM

## 2019-06-28 DIAGNOSIS — H33311 Horseshoe tear of retina without detachment, right eye: Secondary | ICD-10-CM | POA: Diagnosis not present

## 2019-06-28 DIAGNOSIS — H25813 Combined forms of age-related cataract, bilateral: Secondary | ICD-10-CM

## 2019-06-28 MED ORDER — PREDNISOLONE ACETATE 1 % OP SUSP: 1.0000 [drp] | Freq: Four times a day (QID) | OPHTHALMIC | 0 refills | Status: AC

## 2019-07-01 NOTE — Progress Notes (Signed)
[facility] Clinic Note  07/03/2019     CHIEF COMPLAINT Patient presents for Post-op Follow-up   HISTORY OF PRESENT ILLNESS: Jonathan Yang is a 53 y.o. male who presents to the clinic today for:   HPI    Post-op Follow-up    In right eye.  Discomfort includes floaters.  Negative for pain, itching, foreign body sensation, tearing, discharge and none.  Vision is stable.  I, the attending physician,  performed the HPI with the patient and updated documentation appropriately.          Comments    Pt states his vision is not any better, he states he can see light, but the floaters are still there, he states his eye was sore for a couple days after the cryo procedure, but he is not in any pain today, he is still using PF as directed and has been sleeping with his head elevated       Last edited by Bernarda Caffey, MD on 07/03/2019  8:05 AM. (History)    pt states his eye swelled up for a couple days after the cryo procedure  Referring physician: London Pepper, MD [place] [address],  [place] [address]  HISTORICAL INFORMATION:   Selected notes from the [facility] Referred by Dr. Martinique DeMarco for concern of vitreous hemorrhage / retinal tear OD LEE: 08.14.20 (J. DeMarco) Ocular Hx- PMH-   CURRENT MEDICATIONS: Current Outpatient Medications (Ophthalmic Drugs)  Medication Sig  . prednisoLONE acetate (PRED FORTE) 1 % ophthalmic suspension Place 1 drop into the right eye 4 (four) times daily for 7 days.   No current facility-administered medications for this visit. (Ophthalmic Drugs)   No current outpatient medications on file. (Other)   No current facility-administered medications for this visit. (Other)      REVIEW OF SYSTEMS: ROS    Positive for: Eyes   Negative for: Constitutional, Gastrointestinal, Neurological, Skin, Genitourinary, Musculoskeletal, HENT, Endocrine, Cardiovascular, Respiratory, Psychiatric,  Allergic/Imm, Heme/Lymph   Last edited by Debbrah Alar, COT on 07/03/2019  8:05 AM. (History)       ALLERGIES No Known Allergies  PAST MEDICAL HISTORY Past Medical History:  Diagnosis Date  . Cataract    OU   History reviewed. No pertinent surgical history.  FAMILY HISTORY History reviewed. No pertinent family history.  SOCIAL HISTORY Social History   Tobacco Use  . Smoking status: Not on file  Substance Use Topics  . Alcohol use: Not on file  . Drug use: Not on file         OPHTHALMIC EXAM:  Base Eye Exam    Visual Acuity (Snellen - Linear)      Right Left   Dist cc CF at face 20/20 -2   Correction: Glasses       Tonometry (Tonopen, 7:55 AM)      Right Left   Pressure 18 18       Pupils      Dark Light Shape React APD   Right 3 2 Round Brisk None   Left 3 2 Round Brisk None       Visual Fields (Counting fingers)      Left Right    Full        Extraocular Movement      Right Left    Full, Ortho Full, Ortho       Neuro/Psych    Oriented x3: Yes   Mood/Affect: Normal  Dilation    Right eye: 1.0% Mydriacyl, 2.5% Phenylephrine @ 7:55 AM        Slit Lamp and Fundus Exam    Slit Lamp Exam      Right Left   Lids/Lashes Dermatochalasis - upper lid, Meibomian gland dysfunction Dermatochalasis - upper lid, Meibomian gland dysfunction   Conjunctiva/Sclera Subconjunctival hemorrhage White and quiet   Cornea trace Arcus, trace Punctate epithelial erosions trace Arcus, trace Punctate epithelial erosions   Anterior Chamber Deep and quiet Deep and quiet   Iris Round and dilated Round and dilated   Lens 2+ Nuclear sclerosis, 2+ Cortical cataract 2+ Nuclear sclerosis, 2+ Cortical cataract   Vitreous Vitreous syneresis, blood clots settled inferiorly obscuring inferior retina, +RBC's Vitreous syneresis       Fundus Exam      Right Left   Disc obscured by central VH Pink and Sharp   C/D Ratio 0.2 0.2   Macula Obscured by central VH Flat,  Blunted foveal reflex, mild Retinal pigment epithelial mottling, No heme or edema   Vessels Vascular attenuation Vascular attenuation   Periphery Partially obscured by VH, temporal periphery attached, superior retina obscured by heme, blood clot IT periphery, HST at 1130 -- good early cryo changes Attached, mild pigmented lattice at 0700          IMAGING AND PROCEDURES  Imaging and Procedures for @TODAY @  OCT, Retina - OU - Both Eyes       Right Eye Quality was poor. Progression has been stable. Findings include (No image).   Left Eye Quality was good. Central Foveal Thickness: 274. Progression has been stable. Findings include normal foveal contour, no IRF, no SRF, vitreomacular adhesion .   Notes *Images captured and stored on drive  Diagnosis / Impression:  OD: no image OS: NFP, no IRF/SRF   Clinical management:  See below  Abbreviations: NFP - Normal foveal profile. CME - cystoid macular edema. PED - pigment epithelial detachment. IRF - intraretinal fluid. SRF - subretinal fluid. EZ - ellipsoid zone. ERM - epiretinal membrane. ORA - outer retinal atrophy. ORT - outer retinal tubulation. SRHM - subretinal hyper-reflective material                 ASSESSMENT/PLAN:    ICD-10-CM   1. Retinal tear of right eye  H33.311   2. Vitreous hemorrhage of right eye (HCC)  H43.11   3. Posterior vitreous detachment of right eye  H43.811   4. Retinal edema  H35.81 OCT, Retina - OU - Both Eyes  5. Combined forms of age-related cataract of both eyes  H25.813   6. Myopia of both eyes with astigmatism  H52.13    H52.203    1,2. Retinal tear with vitreous hemorrhage, right eye.  .  - HST located at 1130 with heme emanating from retinal flap tear  - significant central vitreous hemorrhage obscuring vision -- persistent  - s/p cryo retinopexy OD (12.18.20) -- good early cryo changes  - VH precautions reviewed -- minimize activities, keep head elevated, avoid ASA/NSAIDs/blood  thinners as able  - continue PF QID OD for 2 more days  - f/u January 7  3. History of Hemorrhagic PVD OD             - acutely symptomatic central floaters, onset Wednesday 8.12.20             - pt spoke to PCP on 08.13.20 who set up consult w/ Dr. 08.15.20 on 08.14.20             -  pt reports boxing trauma to OD ~2 wks prior to initial onset of symptoms             - VH precautions reviewed -- minimize activities, keep head elevated, avoid ASA/NSAIDs/blood thinners as able  4. Mixed form age related cataract OU  - The symptoms of cataract, surgical options, and treatments and risks were discussed with patient.  - discussed diagnosis and progression  - not yet visually significant  - monitor for now  5. History of Myopia with Astigmatism   Ophthalmic Meds Ordered this visit:  No orders of the defined types were placed in this encounter.      Return in about 15 days (around 07/18/2019) for f/u VH OD - Dilated Exam, OCT.  There are no Patient Instructions on file for this visit.   Explained the diagnoses, plan, and follow up with the patient and they expressed understanding.  Patient expressed understanding of the importance of proper follow up care.   This document serves as a record of services personally performed by Karie ChimeraBrian G. Krisandra Bueno, MD, PhD. It was created on their behalf by Annalee Gentaaryl Barber, COMT. The creation of this record is the provider's dictation and/or activities during the visit.  Electronically signed by: Annalee Gentaaryl Barber, COMT 07/03/19 8:26 AM   Karie ChimeraBrian G. Jalon Squier, M.D., Ph.D. Diseases & Surgery of the Retina and Vitreous [facility] 07/03/2019   I have reviewed the above documentation for accuracy and completeness, and I agree with the above. Karie ChimeraBrian G. Seth Friedlander, M.D., Ph.D. 07/03/19 8:26 AM   Abbreviations: M myopia (nearsighted); A astigmatism; H hyperopia (farsighted); P presbyopia; Mrx spectacle prescription;  CTL contact lenses; OD right eye;  OS left eye; OU both eyes  XT exotropia; ET esotropia; PEK punctate epithelial keratitis; PEE punctate epithelial erosions; DES dry eye syndrome; MGD meibomian gland dysfunction; ATs artificial tears; PFAT's preservative free artificial tears; [place] nuclear sclerotic cataract; PSC posterior subcapsular cataract; ERM epi-retinal membrane; PVD posterior vitreous detachment; RD retinal detachment; DM diabetes mellitus; DR diabetic retinopathy; NPDR non-proliferative diabetic retinopathy; PDR proliferative diabetic retinopathy; CSME clinically significant macular edema; DME diabetic macular edema; dbh dot blot hemorrhages; CWS cotton wool spot; POAG primary open angle glaucoma; C/D cup-to-disc ratio; HVF humphrey visual field; GVF goldmann visual field; OCT optical coherence tomography; IOP intraocular pressure; BRVO Branch retinal vein occlusion; CRVO central retinal vein occlusion; CRAO central retinal artery occlusion; BRAO branch retinal artery occlusion; RT retinal tear; SB scleral buckle; PPV pars plana vitrectomy; VH Vitreous hemorrhage; PRP panretinal laser photocoagulation; IVK intravitreal kenalog; VMT vitreomacular traction; MH Macular hole;  NVD neovascularization of the disc; NVE neovascularization elsewhere; AREDS age related eye disease study; ARMD age related macular degeneration; POAG primary open angle glaucoma; EBMD epithelial/anterior basement membrane dystrophy; ACIOL anterior chamber intraocular lens; IOL intraocular lens; PCIOL posterior chamber intraocular lens; Phaco/IOL phacoemulsification with intraocular lens placement; PRK photorefractive keratectomy; LASIK laser assisted in situ keratomileusis; HTN hypertension; DM diabetes mellitus; COPD chronic obstructive pulmonary disease

## 2019-07-03 ENCOUNTER — Encounter (INDEPENDENT_AMBULATORY_CARE_PROVIDER_SITE_OTHER): Payer: Self-pay | Admitting: Ophthalmology

## 2019-07-03 ENCOUNTER — Ambulatory Visit (INDEPENDENT_AMBULATORY_CARE_PROVIDER_SITE_OTHER): Payer: BC Managed Care – PPO | Admitting: Ophthalmology

## 2019-07-03 DIAGNOSIS — H3581 Retinal edema: Secondary | ICD-10-CM | POA: Diagnosis not present

## 2019-07-03 DIAGNOSIS — H33311 Horseshoe tear of retina without detachment, right eye: Secondary | ICD-10-CM

## 2019-07-03 DIAGNOSIS — H4311 Vitreous hemorrhage, right eye: Secondary | ICD-10-CM | POA: Diagnosis not present

## 2019-07-03 DIAGNOSIS — H52203 Unspecified astigmatism, bilateral: Secondary | ICD-10-CM

## 2019-07-03 DIAGNOSIS — H43811 Vitreous degeneration, right eye: Secondary | ICD-10-CM | POA: Diagnosis not present

## 2019-07-03 DIAGNOSIS — H5213 Myopia, bilateral: Secondary | ICD-10-CM

## 2019-07-03 DIAGNOSIS — H25813 Combined forms of age-related cataract, bilateral: Secondary | ICD-10-CM

## 2019-07-15 NOTE — Progress Notes (Addendum)
[facility] - Clinic Note  07/18/2019     CHIEF COMPLAINT Patient presents for Post-op Follow-up   HISTORY OF PRESENT ILLNESS: Jonathan Yang is a 54 y.o. male who presents to the clinic today for:   HPI    Post-op Follow-up    In right eye.  Vision is improved.  I, the attending physician,  performed the HPI with the patient and updated documentation appropriately.          Comments    Patient states vision has improved OD since last visit.  Patient denies eye pain or discomfort.  Patient denies any new or worsening floaters or fol OU.       Last edited by Rennis Chris, MD on 07/18/2019 11:31 AM. (History)    pt states the blood in his eye is clearing up, he states he has been using PF 2-3 times a day or when at night when his eye feels irritated, he denies new fol, headaches or pressure behind the eyes  Referring physician: Farris Has, MD [address] [place],  [place] [address]  HISTORICAL INFORMATION:   Selected notes from the MEDICAL RECORD NUMBER Referred by Dr. [place] DeMarco for concern of vitreous hemorrhage / retinal tear OD LEE: 08.14.20 (J. DeMarco) Ocular Hx- PMH-   CURRENT MEDICATIONS: No current outpatient medications on file. (Ophthalmic Drugs)   No current facility-administered medications for this visit. (Ophthalmic Drugs)   No current outpatient medications on file. (Other)   No current facility-administered medications for this visit. (Other)      REVIEW OF SYSTEMS: ROS    Positive for: Eyes   Negative for: Constitutional, Gastrointestinal, Neurological, Skin, Genitourinary, Musculoskeletal, HENT, Endocrine, Cardiovascular, Respiratory, Psychiatric, Allergic/Imm, Heme/Lymph   Last edited by Corrinne Eagle on 07/18/2019 10:12 AM. (History)       ALLERGIES No Known Allergies  PAST MEDICAL HISTORY Past Medical History:  Diagnosis Date  . Cataract    OU   History reviewed. No pertinent surgical  history.  FAMILY HISTORY History reviewed. No pertinent family history.  SOCIAL HISTORY Social History   Tobacco Use  . Smoking status: Not on file  Substance Use Topics  . Alcohol use: Not on file  . Drug use: Not on file         OPHTHALMIC EXAM:  Base Eye Exam    Visual Acuity (Snellen - Linear)      Right Left   Dist cc 20/30 -2 20/20 -1   Dist ph cc NI    Correction: Glasses       Tonometry (Tonopen, 10:13 AM)      Right Left   Pressure 39 21       Tonometry #2 (Applanation, 10:13 AM)      Right Left   Pressure 41 20       Tonometry #3 (Tonopen, 11:35 AM)      Right Left   Pressure 21 18       Tonometry Comments   1 gtt brimonidine OD @ 10:19AM 1 gtt cosopt OD @ 10:20AM       Pupils      Dark Light Shape React APD   Right 4 3 Round Brisk 0   Left 4 3 Round Brisk 0       Visual Fields      Left Right    Full Full       Extraocular Movement      Right Left  Full Full       Neuro/Psych    Oriented x3: Yes   Mood/Affect: Normal       Dilation    Right eye: 1.0% Mydriacyl, 2.5% Phenylephrine @ 10:19 AM        Slit Lamp and Fundus Exam    Slit Lamp Exam      Right Left   Lids/Lashes Dermatochalasis - upper lid, Meibomian gland dysfunction Dermatochalasis - upper lid, Meibomian gland dysfunction   Conjunctiva/Sclera Focal Subconjunctival hemorrhage IT quad White and quiet   Cornea trace Arcus, trace Punctate epithelial erosions trace Arcus, trace Punctate epithelial erosions   Anterior Chamber Deep and quiet Deep and quiet   Iris Round and dilated Round and dilated   Lens 2+ Nuclear sclerosis, 2+ Cortical cataract 2+ Nuclear sclerosis, 2+ Cortical cataract   Vitreous Vitreous syneresis, blood clots settled inferiorly, 3-4+RBC, interval improvement in diffuse VH Vitreous syneresis       Fundus Exam      Right Left   Disc Hazy view; pink, sharp    C/D Ratio 0.2 0.2   Macula Hazy view, grossly flat    Vessels Vascular attenuation     Periphery Partially obscured by VH, temporal periphery attached, superior retina obscured by heme, blood clot IT periphery, HST at 1130 -- good laser and cryo changes surrounding         Refraction    Wearing Rx      Sphere Cylinder Axis Add   Right -4.25 +1.50 165 +2.50   Left -6.50 +3.00 090 +2.50   Type: prog          IMAGING AND PROCEDURES  Imaging and Procedures for @TODAY @  OCT, Retina - OU - Both Eyes       Right Eye Quality was good. Central Foveal Thickness: 283. Progression has improved. Findings include normal foveal contour, no IRF, no SRF (Interval improvement in vitreous opacities).   Left Eye Quality was good. Central Foveal Thickness: 274. Progression has been stable. Findings include normal foveal contour, no IRF, no SRF, vitreomacular adhesion .   Notes *Images captured and stored on drive  Diagnosis / Impression:  OD: Interval improvement in vitreous opacities; NFP, no IRF/SRF OS: NFP, no IRF/SRF   Clinical management:  See below  Abbreviations: NFP - Normal foveal profile. CME - cystoid macular edema. PED - pigment epithelial detachment. IRF - intraretinal fluid. SRF - subretinal fluid. EZ - ellipsoid zone. ERM - epiretinal membrane. ORA - outer retinal atrophy. ORT - outer retinal tubulation. SRHM - subretinal hyper-reflective material                 ASSESSMENT/PLAN:    ICD-10-CM   1. Retinal tear of right eye  H33.311   2. Vitreous hemorrhage of right eye (HCC)  H43.11   3. Posterior vitreous detachment of right eye  H43.811   4. Combined forms of age-related cataract of both eyes  H25.813   5. Myopia of both eyes with astigmatism  H52.13    H52.203   6. Ocular hypertension of right eye  H40.051   7. Retinal edema  H35.81 OCT, Retina - OU - Both Eyes   1,2. Retinal tear with vitreous hemorrhage, right eye  - HST located at 1130 with heme emanating from retinal flap tear  - significant central vitreous hemorrhage obscuring  vision -- improving  - s/p cryo retinopexy OD (12.18.20) -- good cryo changes  - VH precautions reviewed -- minimize activities, keep head elevated, avoid  ASA/NSAIDs/blood thinners as able  - f/u 2 weeks  3. History of Hemorrhagic PVD OD             - acutely symptomatic central floaters, onset Wednesday 8.12.20             - pt spoke to PCP on 08.13.20 who set up consult w/ Dr. Alver Fisher on 08.14.20             - pt reports boxing trauma to OD ~2 wks prior to initial onset of symptoms             - VH precautions reviewed -- minimize activities, keep head elevated, avoid ASA/NSAIDs/blood thinners as able  4. Mixed form age related cataract OU  - The symptoms of cataract, surgical options, and treatments and risks were discussed with patient.  - discussed diagnosis and progression  - not yet visually significant  - monitor for now  5. History of Myopia with Astigmatism  6. Ocular hypertension OD  - IOP 41 at initial work up -- improved to 21 after 1 drop of Cosopt and Brim OD  - ? Steroid response - pt has continued to use PF beyond the 7 days post op as instructed  - recommend stopping PF  - will start Cosopt BID OD   Ophthalmic Meds Ordered this visit:  No orders of the defined types were placed in this encounter.      Return in about 2 weeks (around 08/01/2019) for f/u retinal tear OD, DFE, OCT.  There are no Patient Instructions on file for this visit.   Explained the diagnoses, plan, and follow up with the patient and they expressed understanding.  Patient expressed understanding of the importance of proper follow up care.   This document serves as a record of services personally performed by Karie Chimera, MD, PhD. It was created on their behalf by Herby Abraham, COA, a certified ophthalmic assistant. The creation of this record is the provider's dictation and/or activities during the visit.    Electronically signed by: Herby Abraham, COA @TODAY @ 10:07 PM   This  document serves as a record of services personally performed by , MD, PhD. It was created on their behalf by Karie Chimera, OA, an ophthalmic assistant. The creation of this record is the provider's dictation and/or activities during the visit.    Electronically signed by: Laurian Brim, OA 01.07.2020 10:07 PM   03.07.2020, M.D., Ph.D. Diseases & Surgery of the Retina and Vitreous [facility]   I have reviewed the above documentation for accuracy and completeness, and I agree with the above. [facility], M.D., Ph.D. 07/18/19 10:07 PM    Abbreviations: M myopia (nearsighted); A astigmatism; H hyperopia (farsighted); P presbyopia; Mrx spectacle prescription;  CTL contact lenses; OD right eye; OS left eye; OU both eyes  XT exotropia; ET esotropia; PEK punctate epithelial keratitis; PEE punctate epithelial erosions; DES dry eye syndrome; MGD meibomian gland dysfunction; ATs artificial tears; PFAT's preservative free artificial tears; NSC nuclear sclerotic cataract; PSC posterior subcapsular cataract; ERM epi-retinal membrane; PVD posterior vitreous detachment; RD retinal detachment; DM diabetes mellitus; DR diabetic retinopathy; NPDR non-proliferative diabetic retinopathy; PDR proliferative diabetic retinopathy; CSME clinically significant macular edema; DME diabetic macular edema; dbh dot blot hemorrhages; CWS cotton wool spot; POAG primary open angle glaucoma; C/D cup-to-disc ratio; HVF humphrey visual field; GVF goldmann visual field; OCT optical coherence tomography; IOP intraocular pressure; BRVO Branch retinal vein occlusion;  CRVO central retinal vein occlusion; CRAO central retinal artery occlusion; BRAO branch retinal artery occlusion; RT retinal tear; SB scleral buckle; PPV pars plana vitrectomy; VH Vitreous hemorrhage; PRP panretinal laser photocoagulation; IVK intravitreal kenalog; VMT vitreomacular traction; MH Macular hole;  NVD neovascularization  of the disc; NVE neovascularization elsewhere; AREDS age related eye disease study; ARMD age related macular degeneration; POAG primary open angle glaucoma; EBMD epithelial/anterior basement membrane dystrophy; ACIOL anterior chamber intraocular lens; IOL intraocular lens; PCIOL posterior chamber intraocular lens; Phaco/IOL phacoemulsification with intraocular lens placement; [place] photorefractive keratectomy; LASIK laser assisted in situ keratomileusis; HTN hypertension; DM diabetes mellitus; COPD chronic obstructive pulmonary disease

## 2019-07-18 ENCOUNTER — Encounter (INDEPENDENT_AMBULATORY_CARE_PROVIDER_SITE_OTHER): Payer: Self-pay | Admitting: Ophthalmology

## 2019-07-18 ENCOUNTER — Ambulatory Visit (INDEPENDENT_AMBULATORY_CARE_PROVIDER_SITE_OTHER): Payer: BC Managed Care – PPO | Admitting: Ophthalmology

## 2019-07-18 DIAGNOSIS — H52203 Unspecified astigmatism, bilateral: Secondary | ICD-10-CM

## 2019-07-18 DIAGNOSIS — H43811 Vitreous degeneration, right eye: Secondary | ICD-10-CM

## 2019-07-18 DIAGNOSIS — H5213 Myopia, bilateral: Secondary | ICD-10-CM

## 2019-07-18 DIAGNOSIS — H4311 Vitreous hemorrhage, right eye: Secondary | ICD-10-CM

## 2019-07-18 DIAGNOSIS — H33311 Horseshoe tear of retina without detachment, right eye: Secondary | ICD-10-CM

## 2019-07-18 DIAGNOSIS — H3581 Retinal edema: Secondary | ICD-10-CM | POA: Diagnosis not present

## 2019-07-18 DIAGNOSIS — H40051 Ocular hypertension, right eye: Secondary | ICD-10-CM

## 2019-07-18 DIAGNOSIS — H25813 Combined forms of age-related cataract, bilateral: Secondary | ICD-10-CM

## 2019-07-26 NOTE — Progress Notes (Signed)
[facility] Clinic Note  08/05/2019     CHIEF COMPLAINT Patient presents for Retina Follow Up   HISTORY OF PRESENT ILLNESS: Jonathan Yang is a 54 y.o. male who presents to the clinic today for:   HPI    Retina Follow Up    Patient presents with  Retinal Break/Detachment.  In right eye.  This started months ago.  Severity is moderate.  Duration of 2 weeks.  Since onset it is stable.  I, the attending physician,  performed the HPI with the patient and updated documentation appropriately.          Comments    54 y/o male pt here for 2 wk f/u for ret tear OD.  S/p cryo retinopexy OD 12.18.20.  VA OD seems to gradually be getting better, but is still blurred, and pt is frustrated at the slow rate of healing and the floaters that are still present OD.  No change in [place] OS.  Denies pain, flashes.  Cosopt BID OD, but did not use this morning.       Last edited by Bernarda Caffey, MD on 08/05/2019  9:59 AM. (History)    Patient states vision is improving slightly OD. Frustrated with slow healing OD and persistence of floaters OD.    Referring physician: London Pepper, MD [address] [place],  [place] [address]  HISTORICAL INFORMATION:   Selected notes from the MEDICAL RECORD NUMBER Referred by Dr. [place] DeMarco for concern of vitreous hemorrhage / retinal tear OD LEE: 08.14.20 (J. DeMarco) Ocular Hx- PMH-   CURRENT MEDICATIONS: No current outpatient medications on file. (Ophthalmic Drugs)   No current facility-administered medications for this visit. (Ophthalmic Drugs)   No current outpatient medications on file. (Other)   No current facility-administered medications for this visit. (Other)      REVIEW OF SYSTEMS: ROS    Positive for: Eyes   Negative for: Constitutional, Gastrointestinal, Neurological, Skin, Genitourinary, Musculoskeletal, HENT, Endocrine, Cardiovascular, Respiratory, Psychiatric, Allergic/Imm, Heme/Lymph   Last edited  by Matthew Folks, COA on 08/05/2019  9:34 AM. (History)       ALLERGIES No Known Allergies  PAST MEDICAL HISTORY Past Medical History:  Diagnosis Date  . Cataract    OU   History reviewed. No pertinent surgical history.  FAMILY HISTORY History reviewed. No pertinent family history.  SOCIAL HISTORY Social History   Tobacco Use  . Smoking status: Not on file  Substance Use Topics  . Alcohol use: Not on file  . Drug use: Not on file         OPHTHALMIC EXAM:  Base Eye Exam    Visual Acuity (Snellen - Linear)      Right Left   Dist cc 20/30 -2 20/20   Dist ph cc NI    Correction: Glasses       Tonometry (Tonopen, 9:37 AM)      Right Left   Pressure 17 16       Pupils      Dark Light Shape React APD   Right 4 3 Round Brisk None   Left 4 3 Round Brisk None       Visual Fields (Counting fingers)      Left Right    Full Full       Extraocular Movement      Right Left    Full, Ortho Full, Ortho       Neuro/Psych    Oriented x3: Yes  Mood/Affect: Normal       Dilation    Both eyes: 1.0% Mydriacyl, 2.5% Phenylephrine @ 9:37 AM        Slit Lamp and Fundus Exam    Slit Lamp Exam      Right Left   Lids/Lashes Dermatochalasis - upper lid, Meibomian gland dysfunction Dermatochalasis - upper lid, Meibomian gland dysfunction   Conjunctiva/Sclera Focal Subconjunctival hemorrhage IT quad White and quiet   Cornea trace Arcus, trace Punctate epithelial erosions trace Arcus, trace Punctate epithelial erosions   Anterior Chamber Deep and quiet Deep and quiet   Iris Round and dilated Round and dilated   Lens 2+ Nuclear sclerosis, 2+ Cortical cataract 2+ Nuclear sclerosis, 2+ Cortical cataract   Vitreous Vitreous syneresis, blood clots settled inferiorly and turning white, 3-4+RBC, interval improvement in diffuse VH Vitreous syneresis       Fundus Exam      Right Left   Disc Hazy view; pink, sharp Pink and Sharp   C/D Ratio 0.2 0.2   Macula Hazy view,  grossly flat Flat, Blunted foveal reflex, mild Retinal pigment epithelial mottling, No heme or edema   Vessels Vascular attenuation Vascular attenuation   Periphery Partially obscured by VH, temporal periphery attached, superior retina obscured by heme, blood clot IT periphery, HST at 1130 -- good laser and cryo changes surrounding Attached, mild pigmented lattice at 0700          IMAGING AND PROCEDURES  Imaging and Procedures for '@TODAY' @  OCT, Retina - OU - Both Eyes       Right Eye Quality was good. Central Foveal Thickness: 289. Progression has been stable. Findings include normal foveal contour, no IRF, no SRF (persistent vitreous opacities).   Left Eye Quality was good. Central Foveal Thickness: 281. Progression has been stable. Findings include normal foveal contour, no IRF, no SRF, vitreomacular adhesion .   Notes *Images captured and stored on drive  Diagnosis / Impression:  OD: persistent vitreous opacities with mild improvement; NFP, no IRF/SRF OS: NFP, no IRF/SRF   Clinical management:  See below  Abbreviations: NFP - Normal foveal profile. CME - cystoid macular edema. PED - pigment epithelial detachment. IRF - intraretinal fluid. SRF - subretinal fluid. EZ - ellipsoid zone. ERM - epiretinal membrane. ORA - outer retinal atrophy. ORT - outer retinal tubulation. SRHM - subretinal hyper-reflective material                 ASSESSMENT/PLAN:    ICD-10-CM   1. Retinal tear of right eye  H33.311   2. Vitreous hemorrhage of right eye ([place])  H43.11   3. Posterior vitreous detachment of right eye  H43.811   4. Combined forms of age-related cataract of both eyes  H25.813   5. Myopia of both eyes with astigmatism  H52.13    H52.203   6. Ocular hypertension of right eye  H40.051   7. Retinal edema  H35.81 OCT, Retina - OU - Both Eyes   1,2. Retinal tear with vitreous hemorrhage, right eye  - HST located at 1130 with heme emanating from retinal flap tear  -  significant central vitreous hemorrhage obscuring vision -- improving slowly  - s/p cryo retinopexy OD (12.18.20) -- good cryo changes  - VH precautions reviewed -- minimize activities, keep head elevated, avoid ASA/NSAIDs/blood thinners as able  - f/u 3 weeks  3. History of Hemorrhagic PVD OD             - acutely symptomatic central floaters,  onset Wednesday 8.12.20             - pt spoke to PCP on 08.13.20 who set up consult w/ Dr. Wonda Horner on 08.14.20             - pt reports boxing trauma to OD ~2 wks prior to initial onset of symptoms             - VH precautions reviewed -- minimize activities, keep head elevated, avoid ASA/NSAIDs/blood thinners as able  4. Mixed form age related cataract OU  - The symptoms of cataract, surgical options, and treatments and risks were discussed with patient.  - discussed diagnosis and progression  - not yet visually significant  - monitor for now  5. History of Myopia with Astigmatism  6. Ocular hypertension OD   - IOP 41 at initial work up last visit-- IOP OK, 17 today  - likely steroid response - pt used PF beyond the 7 days post op as instructed   - continue Cosopt BID OD for now   Ophthalmic Meds Ordered this visit:  No orders of the defined types were placed in this encounter.      Return in about 3 weeks (around 08/26/2019) for DFE, OCT.  There are no Patient Instructions on file for this visit.   Explained the diagnoses, plan, and follow up with the patient and they expressed understanding.  Patient expressed understanding of the importance of proper follow up care.   This document serves as a record of services personally performed by Gardiner Sleeper, MD, PhD. It was created on their behalf by Roselee Nova, COMT. The creation of this record is the provider's dictation and/or activities during the visit.  Electronically signed by: Roselee Nova, COMT 08/05/19 4:28 PM  Gardiner Sleeper, M.D., Ph.D. Diseases & Surgery of the  Retina and [place] 08/05/2019   I have reviewed the above documentation for accuracy and completeness, and I agree with the above. Gardiner Sleeper, M.D., Ph.D. 08/05/19 4:28 PM   Abbreviations: M myopia (nearsighted); A astigmatism; H hyperopia (farsighted); P presbyopia; Mrx spectacle prescription;  CTL contact lenses; OD right eye; OS left eye; OU both eyes  XT exotropia; ET esotropia; PEK punctate epithelial keratitis; PEE punctate epithelial erosions; DES dry eye syndrome; MGD meibomian gland dysfunction; ATs artificial tears; PFAT's preservative free artificial tears; [place] nuclear sclerotic cataract; PSC posterior subcapsular cataract; ERM epi-retinal membrane; PVD posterior vitreous detachment; RD retinal detachment; DM diabetes mellitus; DR diabetic retinopathy; NPDR non-proliferative diabetic retinopathy; PDR proliferative diabetic retinopathy; CSME clinically significant macular edema; DME diabetic macular edema; dbh dot blot hemorrhages; CWS cotton wool spot; POAG primary open angle glaucoma; C/D cup-to-disc ratio; HVF humphrey visual field; GVF goldmann visual field; OCT optical coherence tomography; IOP intraocular pressure; BRVO Branch retinal vein occlusion; CRVO central retinal vein occlusion; CRAO central retinal artery occlusion; BRAO branch retinal artery occlusion; RT retinal tear; SB scleral buckle; PPV pars plana vitrectomy; VH Vitreous hemorrhage; PRP panretinal laser photocoagulation; IVK intravitreal kenalog; VMT vitreomacular traction; MH Macular hole;  NVD neovascularization of the disc; NVE neovascularization elsewhere; AREDS age related eye disease study; ARMD age related macular degeneration; POAG primary open angle glaucoma; EBMD epithelial/anterior basement membrane dystrophy; ACIOL anterior chamber intraocular lens; IOL intraocular lens; PCIOL posterior chamber intraocular lens; Phaco/IOL phacoemulsification with intraocular lens placement;  [place] photorefractive keratectomy; LASIK laser assisted in situ keratomileusis; HTN hypertension; DM diabetes mellitus; COPD chronic obstructive pulmonary disease

## 2019-08-05 ENCOUNTER — Encounter (INDEPENDENT_AMBULATORY_CARE_PROVIDER_SITE_OTHER): Payer: Self-pay | Admitting: Ophthalmology

## 2019-08-05 ENCOUNTER — Ambulatory Visit (INDEPENDENT_AMBULATORY_CARE_PROVIDER_SITE_OTHER): Payer: BC Managed Care – PPO | Admitting: Ophthalmology

## 2019-08-05 DIAGNOSIS — H5213 Myopia, bilateral: Secondary | ICD-10-CM

## 2019-08-05 DIAGNOSIS — H43811 Vitreous degeneration, right eye: Secondary | ICD-10-CM | POA: Diagnosis not present

## 2019-08-05 DIAGNOSIS — H4311 Vitreous hemorrhage, right eye: Secondary | ICD-10-CM | POA: Diagnosis not present

## 2019-08-05 DIAGNOSIS — H33311 Horseshoe tear of retina without detachment, right eye: Secondary | ICD-10-CM | POA: Diagnosis not present

## 2019-08-05 DIAGNOSIS — H40051 Ocular hypertension, right eye: Secondary | ICD-10-CM

## 2019-08-05 DIAGNOSIS — H52203 Unspecified astigmatism, bilateral: Secondary | ICD-10-CM

## 2019-08-05 DIAGNOSIS — H3581 Retinal edema: Secondary | ICD-10-CM | POA: Diagnosis not present

## 2019-08-05 DIAGNOSIS — H25813 Combined forms of age-related cataract, bilateral: Secondary | ICD-10-CM

## 2019-08-13 NOTE — Progress Notes (Signed)
[facility] Note  08/26/2019     CHIEF COMPLAINT Patient presents for Retina Follow Up   HISTORY OF PRESENT ILLNESS: Jonathan Yang is a 54 y.o. male who presents to the clinic today for:   HPI    Retina Follow Up    Patient presents with  Retinal Break/Detachment.  In right eye.  This started months ago.  Severity is moderate.  Duration of 3 weeks.  Since onset it is gradually improving.  I, the attending physician,  performed the HPI with the patient and updated documentation appropriately.          Comments    54 y/o male pt here for 3 wk f/u for ret tear OD.  S/p cryo retinopexy OD 12.18.20.  Feels VA OD is gradually improving over the past couple of days.  No change in [place] OS.  Denies pain, flashes, new floaters.  Cosopt bid OD, but forgot to take it this a.m.       Last edited by Bernarda Caffey, MD on 08/26/2019  1:40 PM. (History)    Pt states he started to notice any improvement in vision over the past 2 days  Referring physician: London Pepper, MD [address],  [place] [address]  HISTORICAL INFORMATION:   Selected notes from the [facility] Referred by Dr. [place] DeMarco for concern of vitreous hemorrhage / retinal tear OD LEE: 08.14.20 (J. DeMarco) Ocular Hx- PMH-   CURRENT MEDICATIONS: No current outpatient medications on file. (Ophthalmic Drugs)   No current facility-administered medications for this visit. (Ophthalmic Drugs)   No current outpatient medications on file. (Other)   No current facility-administered medications for this visit. (Other)      REVIEW OF SYSTEMS: ROS    Positive for: Eyes   Negative for: Constitutional, Gastrointestinal, Neurological, Skin, Genitourinary, Musculoskeletal, HENT, Endocrine, Cardiovascular, Respiratory, Psychiatric, Allergic/Imm, Heme/Lymph   Last edited by Matthew Folks, COA on 08/26/2019 10:03 AM. (History)       ALLERGIES No Known  Allergies  PAST MEDICAL HISTORY Past Medical History:  Diagnosis Date  . Cataract    OU   History reviewed. No pertinent surgical history.  FAMILY HISTORY History reviewed. No pertinent family history.  SOCIAL HISTORY Social History   Tobacco Use  . Smoking status: Not on file  Substance Use Topics  . Alcohol use: Not on file  . Drug use: Not on file         OPHTHALMIC EXAM:  Base Eye Exam    Visual Acuity (Snellen - Linear)      Right Left   Dist cc 20/25 -2 20/20   Dist ph cc NI    Correction: Glasses       Tonometry (Tonopen, 10:10 AM)      Right Left   Pressure 13 14       Pupils      Dark Light Shape React APD   Right 4 3 Round Brisk None   Left 4 3 Round Brisk None       Visual Fields (Counting fingers)      Left Right     Full       Extraocular Movement      Right Left    Full, Ortho Full, Ortho       Neuro/Psych    Oriented x3: Yes   Mood/Affect: Normal       Dilation    Both eyes: 1.0% Mydriacyl, 2.5%  Phenylephrine @ 10:10 AM        Slit Lamp and Fundus Exam    Slit Lamp Exam      Right Left   Lids/Lashes Dermatochalasis - upper lid, Meibomian gland dysfunction Dermatochalasis - upper lid, Meibomian gland dysfunction   Conjunctiva/Sclera Focal Subconjunctival hemorrhage IT quad White and quiet   Cornea trace Arcus, 1+ Punctate epithelial erosions trace Arcus, 1+ Punctate epithelial erosions   Anterior Chamber Deep and quiet Deep and quiet   Iris Round and dilated Round and dilated   Lens 2+ Nuclear sclerosis, 2+ Cortical cataract 2+ Nuclear sclerosis, 2+ Cortical cataract   Vitreous Vitreous syneresis, blood clots settled inferiorly and turning white, +RBC in anterior vitreous, diffuse VH clearing and settling inferiorly Vitreous syneresis       Fundus Exam      Right Left   Disc Hazy view; pink, sharp Pink and Sharp   C/D Ratio 0.2 0.2   Macula Hazy view, grossly flat Flat, Blunted foveal reflex, mild Retinal pigment  epithelial mottling, No heme or edema   Vessels Vascular attenuation Vascular attenuation   Periphery Very hazy view inferiorly, attached elsewhere, blood clot IT periphery turning white, HST at 1130 -- good laser and cryo changes surrounding Attached, mild pigmented lattice at 0700          IMAGING AND PROCEDURES  Imaging and Procedures for '@TODAY'$ @  OCT, Retina - OU - Both Eyes       Right Eye Quality was good. Central Foveal Thickness: 285. Progression has improved. Findings include normal foveal contour, no IRF, no SRF (Mild interval improvement in vitreous opacities -- settling inferiorly).   Left Eye Quality was good. Central Foveal Thickness: 281. Progression has been stable. Findings include normal foveal contour, no IRF, no SRF, vitreomacular adhesion .   Notes *Images captured and stored on drive  Diagnosis / Impression:  OD: NFP, no IRF/SRF -- Mild interval improvement in vitreous opacities -- settling inferiorly OS: NFP, no IRF/SRF   Clinical management:  See below  Abbreviations: NFP - Normal foveal profile. CME - cystoid macular edema. PED - pigment epithelial detachment. IRF - intraretinal fluid. SRF - subretinal fluid. EZ - ellipsoid zone. ERM - epiretinal membrane. ORA - outer retinal atrophy. ORT - outer retinal tubulation. SRHM - subretinal hyper-reflective material                 ASSESSMENT/PLAN:    ICD-10-CM   1. Retinal tear of right eye  H33.311   2. Vitreous hemorrhage of right eye ([place])  H43.11   3. Posterior vitreous detachment of right eye  H43.811   4. Combined forms of age-related cataract of both eyes  H25.813   5. Myopia of both eyes with astigmatism  H52.13    H52.203   6. Ocular hypertension of right eye  H40.051   7. Retinal edema  H35.81 OCT, Retina - OU - Both Eyes   1,2. Retinal tear with vitreous hemorrhage, right eye  - HST located at 1130 with heme emanating from retinal flap tear  - significant central vitreous  hemorrhage obscuring vision -- improving slowly  - s/p cryo retinopexy OD (12.18.20) -- good cryo changes  - VH precautions reviewed -- minimize activities, keep head elevated, avoid ASA/NSAIDs/blood thinners as able  - f/u 3 weeks  3. History of Hemorrhagic PVD OD             - acutely symptomatic central floaters, onset Wednesday 8.12.20             -  pt spoke to PCP on 08.13.20 who set up consult w/ Dr. Wonda Horner on 08.14.20             - pt reports boxing trauma to OD ~2 wks prior to initial onset of symptoms             - VH precautions reviewed -- minimize activities, keep head elevated, avoid ASA/NSAIDs/blood thinners as able  4. Mixed form age related cataract OU  - The symptoms of cataract, surgical options, and treatments and risks were discussed with patient.  - discussed diagnosis and progression  - not yet visually significant  - monitor for now  5. History of Myopia with Astigmatism  6. Ocular hypertension OD   - IOP 41 at initial (01.07.21) visit -- IOP okay at 13 today  - likely steroid response - pt used PF beyond the 7 days post op as instructed   - continue Cosopt BID OD for now   Ophthalmic Meds Ordered this visit:  No orders of the defined types were placed in this encounter.      Return in about 3 weeks (around 09/16/2019) for f/u VH OD, DFE, OCT.  There are no Patient Instructions on file for this visit.   Explained the diagnoses, plan, and follow up with the patient and they expressed understanding.  Patient expressed understanding of the importance of proper follow up care.   This document serves as a record of services personally performed by Gardiner Sleeper, MD, PhD. It was created on their behalf by Leeann Must, [place], a certified ophthalmic assistant. The creation of this record is the provider's dictation and/or activities during the visit.    Electronically signed by: Leeann Must, COA '@TODAY'$ @ 1:47 PM   This document serves as a record of  services personally performed by Gardiner Sleeper, MD, PhD. It was created on their behalf by Ernest Mallick, OA, an ophthalmic assistant. The creation of this record is the provider's dictation and/or activities during the visit.    Electronically signed by: Ernest Mallick, OA 02.15.2021 1:47 PM   Gardiner Sleeper, M.D., Ph.D. Diseases & Surgery of the Retina and Vitreous [facility]  I have reviewed the above documentation for accuracy and completeness, and I agree with the above. Gardiner Sleeper, M.D., Ph.D. 08/26/19 1:47 PM    Abbreviations: M myopia (nearsighted); A astigmatism; H hyperopia (farsighted); P presbyopia; Mrx spectacle prescription;  CTL contact lenses; OD right eye; OS left eye; OU both eyes  XT exotropia; ET esotropia; PEK punctate epithelial keratitis; PEE punctate epithelial erosions; DES dry eye syndrome; MGD meibomian gland dysfunction; ATs artificial tears; PFAT's preservative free artificial tears; [place] nuclear sclerotic cataract; PSC posterior subcapsular cataract; ERM epi-retinal membrane; PVD posterior vitreous detachment; RD retinal detachment; DM diabetes mellitus; DR diabetic retinopathy; NPDR non-proliferative diabetic retinopathy; PDR proliferative diabetic retinopathy; CSME clinically significant macular edema; DME diabetic macular edema; dbh dot blot hemorrhages; CWS cotton wool spot; POAG primary open angle glaucoma; C/D cup-to-disc ratio; HVF humphrey visual field; GVF goldmann visual field; OCT optical coherence tomography; IOP intraocular pressure; BRVO Branch retinal vein occlusion; CRVO central retinal vein occlusion; CRAO central retinal artery occlusion; BRAO branch retinal artery occlusion; RT retinal tear; SB scleral buckle; PPV pars plana vitrectomy; VH Vitreous hemorrhage; PRP panretinal laser photocoagulation; IVK intravitreal kenalog; VMT vitreomacular traction; MH Macular hole;  NVD neovascularization of the disc; NVE neovascularization  elsewhere; AREDS age related eye disease study; ARMD age related macular degeneration;  POAG primary open angle glaucoma; EBMD epithelial/anterior basement membrane dystrophy; ACIOL anterior chamber intraocular lens; IOL intraocular lens; PCIOL posterior chamber intraocular lens; Phaco/IOL phacoemulsification with intraocular lens placement; [place] photorefractive keratectomy; LASIK laser assisted in situ keratomileusis; HTN hypertension; DM diabetes mellitus; COPD chronic obstructive pulmonary disease

## 2019-08-26 ENCOUNTER — Ambulatory Visit (INDEPENDENT_AMBULATORY_CARE_PROVIDER_SITE_OTHER): Payer: BC Managed Care – PPO | Admitting: Ophthalmology

## 2019-08-26 ENCOUNTER — Encounter (INDEPENDENT_AMBULATORY_CARE_PROVIDER_SITE_OTHER): Payer: Self-pay | Admitting: Ophthalmology

## 2019-08-26 DIAGNOSIS — H4311 Vitreous hemorrhage, right eye: Secondary | ICD-10-CM | POA: Diagnosis not present

## 2019-08-26 DIAGNOSIS — H33311 Horseshoe tear of retina without detachment, right eye: Secondary | ICD-10-CM

## 2019-08-26 DIAGNOSIS — H25813 Combined forms of age-related cataract, bilateral: Secondary | ICD-10-CM

## 2019-08-26 DIAGNOSIS — H52203 Unspecified astigmatism, bilateral: Secondary | ICD-10-CM

## 2019-08-26 DIAGNOSIS — H43811 Vitreous degeneration, right eye: Secondary | ICD-10-CM

## 2019-08-26 DIAGNOSIS — H3581 Retinal edema: Secondary | ICD-10-CM

## 2019-08-26 DIAGNOSIS — H40051 Ocular hypertension, right eye: Secondary | ICD-10-CM

## 2019-08-26 DIAGNOSIS — H5213 Myopia, bilateral: Secondary | ICD-10-CM

## 2019-09-10 NOTE — Progress Notes (Signed)
[facility] - Clinic Note  09/16/2019     CHIEF COMPLAINT Patient presents for Retina Follow Up   HISTORY OF PRESENT ILLNESS: Jonathan Yang is a 54 y.o. male who presents to the clinic today for:   HPI    Retina Follow Up    Patient presents with  Retinal Break/Detachment.  In right eye.  This started months ago.  Severity is mild.  Duration of 3 weeks.  Since onset it is gradually improving.  I, the attending physician,  performed the HPI with the patient and updated documentation appropriately.          Comments    54 y/o male pt here for 3 wk f/u for VH OD.  Blur and floaters gradually clearing OD.  No change in [place] OS.  Denies pain, flashes.  Cosopt BID OD (running low).       Last edited by Rennis Chris, MD on 09/16/2019  3:02 PM. (History)    Pt feels like some of the bigger clots in his eye have dissipated, but he still sees a "haze", he is still sleeping with his head elevated  Referring physician: Farris Has, MD [address] [place],  [place] [address]  HISTORICAL INFORMATION:   Selected notes from the [facility] Referred by Dr. [place] DeMarco for concern of vitreous hemorrhage / retinal tear OD LEE: 08.14.20 (J. DeMarco) Ocular Hx- PMH-   CURRENT MEDICATIONS: No current outpatient medications on file. (Ophthalmic Drugs)   No current facility-administered medications for this visit. (Ophthalmic Drugs)   No current outpatient medications on file. (Other)   No current facility-administered medications for this visit. (Other)      REVIEW OF SYSTEMS: ROS    Positive for: Eyes   Negative for: Constitutional, Gastrointestinal, Neurological, Skin, Genitourinary, Musculoskeletal, HENT, Endocrine, Cardiovascular, Respiratory, Psychiatric, Allergic/Imm, Heme/Lymph   Last edited by Celine Mans, COA on 09/16/2019  2:59 PM. (History)       ALLERGIES No Known Allergies  PAST MEDICAL HISTORY Past Medical  History:  Diagnosis Date  . Cataract    OU   History reviewed. No pertinent surgical history.  FAMILY HISTORY History reviewed. No pertinent family history.  SOCIAL HISTORY Social History   Tobacco Use  . Smoking status: Not on file  Substance Use Topics  . Alcohol use: Not on file  . Drug use: Not on file         OPHTHALMIC EXAM:  Base Eye Exam    Visual Acuity (Snellen - Linear)      Right Left   Dist cc 20/20 + 20/20 +2   Correction: Glasses       Tonometry (Tonopen, 3:01 PM)      Right Left   Pressure 13 17       Pupils      Dark Light Shape React APD   Right 4 3 Round Brisk None   Left 4 3 Round Brisk None       Visual Fields (Counting fingers)      Left Right    Full Full       Extraocular Movement      Right Left    Full, Ortho Full, Ortho       Neuro/Psych    Oriented x3: Yes   Mood/Affect: Normal       Dilation    Both eyes: 1.0% Mydriacyl, 2.5% Phenylephrine @ 3:01 PM        Slit Lamp  and Fundus Exam    Slit Lamp Exam      Right Left   Lids/Lashes Dermatochalasis - upper lid, Meibomian gland dysfunction Dermatochalasis - upper lid, Meibomian gland dysfunction   Conjunctiva/Sclera Focal Subconjunctival hemorrhage IT quad White and quiet   Cornea trace Arcus, 1+ Punctate epithelial erosions trace Arcus, 1+ Punctate epithelial erosions   Anterior Chamber Deep and quiet Deep and quiet   Iris Round and dilated Round and dilated   Lens 2+ Nuclear sclerosis, 2+ Cortical cataract 2+ Nuclear sclerosis, 2+ Cortical cataract   Vitreous Vitreous syneresis, blood clots settled inferiorly and turning white, diffuse RBC in anterior vitreous;VH thinning centrally and settling inferiorly Vitreous syneresis       Fundus Exam      Right Left   Disc Hazy view; pink, sharp Pink and Sharp   C/D Ratio 0.2 0.2   Macula Flat, Blunted foveal reflex, RPE mottling and clumping Flat, Blunted foveal reflex, mild Retinal pigment epithelial mottling, No heme  or edema   Vessels Vascular attenuation Vascular attenuation   Periphery Very hazy view inferiorly, attached elsewhere, blood clot IT periphery turning white, HST at 1130 -- good laser and cryo changes surrounding Attached, mild pigmented lattice at 0700          IMAGING AND PROCEDURES  Imaging and Procedures for @TODAY @  OCT, Retina - OU - Both Eyes       Right Eye Quality was good. Central Foveal Thickness: 282. Progression has been stable. Findings include normal foveal contour, no IRF, no SRF (Persistent vitreous opacities ).   Left Eye Quality was good. Central Foveal Thickness: 278. Progression has been stable. Findings include normal foveal contour, no IRF, no SRF, vitreomacular adhesion .   Notes *Images captured and stored on drive  Diagnosis / Impression:  OD: NFP, no IRF/SRF -- persistent vitreous opacities OS: NFP, no IRF/SRF   Clinical management:  See below  Abbreviations: NFP - Normal foveal profile. CME - cystoid macular edema. PED - pigment epithelial detachment. IRF - intraretinal fluid. SRF - subretinal fluid. EZ - ellipsoid zone. ERM - epiretinal membrane. ORA - outer retinal atrophy. ORT - outer retinal tubulation. SRHM - subretinal hyper-reflective material                 ASSESSMENT/PLAN:    ICD-10-CM   1. Retinal tear of right eye  H33.311   2. Posterior vitreous detachment of right eye  H43.811   3. Vitreous hemorrhage of right eye (H[facility])  H43.11   4. Combined forms of age-related cataract of both eyes  H25.813   5. Retinal edema  H35.81 OCT, Retina - OU - Both Eyes  6. Myopia of both eyes with astigmatism  H52.13    H52.203   7. Ocular hypertension of right eye  H40.051    1,2. Retinal tear with vitreous hemorrhage, right eye  - HST located at 1130 with heme emanating from retinal flap tear  - significant central vitreous hemorrhage obscuring vision -- improving slowly  - s/p cryo retinopexy OD (12.18.20) -- good cryo changes  - VH  improving w/ BCVA 20/20 OD  - VH precautions reviewed -- minimize activities, keep head elevated, avoid ASA/NSAIDs/blood thinners as able  - f/u 4 weeks  3. History of Hemorrhagic PVD OD             - acutely symptomatic central floaters, onset Wednesday 8.12.20             - pt spoke to  PCP on 08.13.20 who set up consult w/ Dr. Wonda Horner on 08.14.20             - pt reports boxing trauma to OD ~2 wks prior to initial onset of symptoms             - VH precautions reviewed -- minimize activities, keep head elevated, avoid ASA/NSAIDs/blood thinners as able  4. Mixed form age related cataract OU  - The symptoms of cataract, surgical options, and treatments and risks were discussed with patient.  - discussed diagnosis and progression  - not yet visually significant  - monitor for now  5. History of Myopia with Astigmatism  6. Ocular hypertension OD   - IOP 41 at initial (01.07.21) visit -- IOP okay at 13 today  - likely steroid response - pt used PF beyond the 7 days post op as instructed   - continue Cosopt BID OD for now   Ophthalmic Meds Ordered this visit:  No orders of the defined types were placed in this encounter.      Return in about 4 weeks (around 10/14/2019) for f/u VH OD, DFE, OCT.  There are no Patient Instructions on file for this visit.   Explained the diagnoses, plan, and follow up with the patient and they expressed understanding.  Patient expressed understanding of the importance of proper follow up care.   This document serves as a record of services personally performed by Gardiner Sleeper, MD, PhD. It was created on their behalf by Leeann Must, [place], a certified ophthalmic assistant. The creation of this record is the provider's dictation and/or activities during the visit.    Electronically signed by: Leeann Must, COA @TODAY @ 3:45 PM   This document serves as a record of services personally performed by Gardiner Sleeper, MD, PhD. It was created on their  behalf by Ernest Mallick, OA, an ophthalmic assistant. The creation of this record is the provider's dictation and/or activities during the visit.    Electronically signed by: Ernest Mallick, OA 03.08.2021 3:45 PM   Gardiner Sleeper, M.D., Ph.D. Diseases & Surgery of the Retina and Vitreous [facility]  I have reviewed the above documentation for accuracy and completeness, and I agree with the above. Gardiner Sleeper, M.D., Ph.D. 09/16/19 3:45 PM   Abbreviations: M myopia (nearsighted); A astigmatism; H hyperopia (farsighted); P presbyopia; Mrx spectacle prescription;  CTL contact lenses; OD right eye; OS left eye; OU both eyes  XT exotropia; ET esotropia; PEK punctate epithelial keratitis; PEE punctate epithelial erosions; DES dry eye syndrome; MGD meibomian gland dysfunction; ATs artificial tears; PFAT's preservative free artificial tears; [place] nuclear sclerotic cataract; PSC posterior subcapsular cataract; ERM epi-retinal membrane; PVD posterior vitreous detachment; RD retinal detachment; DM diabetes mellitus; DR diabetic retinopathy; NPDR non-proliferative diabetic retinopathy; PDR proliferative diabetic retinopathy; CSME clinically significant macular edema; DME diabetic macular edema; dbh dot blot hemorrhages; CWS cotton wool spot; POAG primary open angle glaucoma; C/D cup-to-disc ratio; HVF humphrey visual field; GVF goldmann visual field; OCT optical coherence tomography; IOP intraocular pressure; BRVO Branch retinal vein occlusion; CRVO central retinal vein occlusion; CRAO central retinal artery occlusion; BRAO branch retinal artery occlusion; RT retinal tear; SB scleral buckle; PPV pars plana vitrectomy; VH Vitreous hemorrhage; PRP panretinal laser photocoagulation; IVK intravitreal kenalog; VMT vitreomacular traction; MH Macular hole;  NVD neovascularization of the disc; NVE neovascularization elsewhere; AREDS age related eye disease study; ARMD age related macular  degeneration; POAG primary open angle  glaucoma; EBMD epithelial/anterior basement membrane dystrophy; ACIOL anterior chamber intraocular lens; IOL intraocular lens; PCIOL posterior chamber intraocular lens; Phaco/IOL phacoemulsification with intraocular lens placement; [place] photorefractive keratectomy; LASIK laser assisted in situ keratomileusis; HTN hypertension; DM diabetes mellitus; COPD chronic obstructive pulmonary disease

## 2019-09-16 ENCOUNTER — Ambulatory Visit (INDEPENDENT_AMBULATORY_CARE_PROVIDER_SITE_OTHER): Payer: BC Managed Care – PPO | Admitting: Ophthalmology

## 2019-09-16 ENCOUNTER — Encounter (INDEPENDENT_AMBULATORY_CARE_PROVIDER_SITE_OTHER): Payer: Self-pay | Admitting: Ophthalmology

## 2019-09-16 DIAGNOSIS — H52203 Unspecified astigmatism, bilateral: Secondary | ICD-10-CM

## 2019-09-16 DIAGNOSIS — H4311 Vitreous hemorrhage, right eye: Secondary | ICD-10-CM | POA: Diagnosis not present

## 2019-09-16 DIAGNOSIS — H33311 Horseshoe tear of retina without detachment, right eye: Secondary | ICD-10-CM

## 2019-09-16 DIAGNOSIS — H25813 Combined forms of age-related cataract, bilateral: Secondary | ICD-10-CM

## 2019-09-16 DIAGNOSIS — H3581 Retinal edema: Secondary | ICD-10-CM

## 2019-09-16 DIAGNOSIS — H40051 Ocular hypertension, right eye: Secondary | ICD-10-CM

## 2019-09-16 DIAGNOSIS — H43811 Vitreous degeneration, right eye: Secondary | ICD-10-CM

## 2019-09-16 DIAGNOSIS — H5213 Myopia, bilateral: Secondary | ICD-10-CM

## 2019-10-02 DIAGNOSIS — F913 Oppositional defiant disorder: Secondary | ICD-10-CM | POA: Diagnosis not present

## 2019-10-10 NOTE — Progress Notes (Addendum)
[facility] Note  10/14/2019     CHIEF COMPLAINT Patient presents for Retina Follow Up   HISTORY OF PRESENT ILLNESS: Jonathan Yang is a 54 y.o. male who presents to the clinic today for:   HPI    Retina Follow Up    Patient presents with  Other.  In right eye.  This started 4 weeks ago.  Severity is moderate.  I, the attending physician,  performed the HPI with the patient and updated documentation appropriately.          Comments    Patient here for 4 weeks retina follow up for VH OD. Patient states vision seems like it is improving OD. No eye pain.        Last edited by Bernarda Caffey, MD on 10/14/2019  4:29 PM. (History)    Pt feels like his vision is better, he states the cloudiness comes and goes, he states he can tell that his vision gets worse with more activity, no new fol  Referring physician: London Pepper, MD [address],  [place] [address]  HISTORICAL INFORMATION:   Selected notes from the [facility] Referred by Dr. Martinique DeMarco for concern of vitreous hemorrhage / retinal tear OD LEE: 08.14.20 (J. DeMarco) Ocular Hx- PMH-   CURRENT MEDICATIONS: No current outpatient medications on file. (Ophthalmic Drugs)   No current facility-administered medications for this visit. (Ophthalmic Drugs)   No current outpatient medications on file. (Other)   No current facility-administered medications for this visit. (Other)      REVIEW OF SYSTEMS: ROS    Positive for: Eyes   Negative for: Constitutional, Gastrointestinal, Neurological, Skin, Genitourinary, Musculoskeletal, HENT, Endocrine, Cardiovascular, Respiratory, Psychiatric, Allergic/Imm, Heme/Lymph   Last edited by Theodore Demark, COA on 10/14/2019  2:04 PM. (History)       ALLERGIES No Known Allergies  PAST MEDICAL HISTORY Past Medical History:  Diagnosis Date  . Cataract    OU   History reviewed. No pertinent surgical  history.  FAMILY HISTORY History reviewed. No pertinent family history.  SOCIAL HISTORY Social History   Tobacco Use  . Smoking status: Not on file  Substance Use Topics  . Alcohol use: Not on file  . Drug use: Not on file         OPHTHALMIC EXAM:  Base Eye Exam    Visual Acuity (Snellen - Linear)      Right Left   Dist cc 20/20 -1 20/20   Correction: Glasses       Tonometry (Tonopen, 2:01 PM)      Right Left   Pressure 08 09       Pupils      Dark Light Shape React APD   Right 4 3 Round Brisk None   Left 4 3 Round Brisk None       Visual Fields (Counting fingers)      Left Right    Full Full       Extraocular Movement      Right Left    Full, Ortho Full, Ortho       Neuro/Psych    Oriented x3: Yes   Mood/Affect: Normal       Dilation    Both eyes: 1.0% Mydriacyl, 2.5% Phenylephrine @ 2:01 PM        Slit Lamp and Fundus Exam    Slit Lamp Exam      Right Left   Lids/Lashes Dermatochalasis -  upper lid, Meibomian gland dysfunction Dermatochalasis - upper lid, Meibomian gland dysfunction   Conjunctiva/Sclera Focal Subconjunctival hemorrhage IT quad White and quiet   Cornea trace Arcus, 1+ Punctate epithelial erosions trace Arcus, 1+ Punctate epithelial erosions   Anterior Chamber Deep and quiet Deep and quiet   Iris Round and dilated Round and dilated   Lens 2+ Nuclear sclerosis, 2+ Cortical cataract 2+ Nuclear sclerosis, 2+ Cortical cataract   Vitreous Vitreous syneresis, white blood clots settled inferiorly, 2-3+ diffuse RBC in anterior vitreous; VH thinning centrally and settling inferiorly Vitreous syneresis       Fundus Exam      Right Left   Disc Compact, Sharp rim Pink and Sharp   C/D Ratio 0.2 0.2   Macula Flat, Blunted foveal reflex, RPE mottling and clumping, No heme or edema Flat, Blunted foveal reflex, mild Retinal pigment epithelial mottling, No heme or edema   Vessels mild Vascular attenuation, Tortuousity Vascular attenuation    Periphery Very hazy view inferiorly, attached elsewhere, blood clot IT periphery turning white, HST at 1130 -- good laser and cryo changes surrounding Attached, mild pigmented lattice at 0700        Refraction    Wearing Rx      Sphere Cylinder Axis Add   Right -4.25 +1.50 165 +2.50   Left -6.50 +3.00 090 +2.50   Type: prog          IMAGING AND PROCEDURES  Imaging and Procedures for @TODAY @  OCT, Retina - OU - Both Eyes       Right Eye Quality was good. Central Foveal Thickness: 286. Progression has been stable. Findings include normal foveal contour, no IRF, no SRF (Mild interval improvement in vitreous opacities ).   Left Eye Quality was good. Central Foveal Thickness: 285. Progression has been stable. Findings include normal foveal contour, no IRF, no SRF, vitreomacular adhesion .   Notes *Images captured and stored on drive  Diagnosis / Impression:  OD: NFP, no IRF/SRF -- Mild interval improvement in vitreous opacities  OS: NFP, no IRF/SRF   Clinical management:  See below  Abbreviations: NFP - Normal foveal profile. CME - cystoid macular edema. PED - pigment epithelial detachment. IRF - intraretinal fluid. SRF - subretinal fluid. EZ - ellipsoid zone. ERM - epiretinal membrane. ORA - outer retinal atrophy. ORT - outer retinal tubulation. SRHM - subretinal hyper-reflective material                 ASSESSMENT/PLAN:    ICD-10-CM   1. Retinal tear of right eye  H33.311   2. Vitreous hemorrhage of right eye (HCC)  H43.11   3. Posterior vitreous detachment of right eye  H43.811   4. Combined forms of age-related cataract of both eyes  H25.813   5. Myopia of both eyes with astigmatism  H52.13    H52.203   6. Ocular hypertension of right eye  H40.051   7. Retinal edema  H35.81 OCT, Retina - OU - Both Eyes   1,2. Retinal tear with vitreous hemorrhage, right eye  - HST located at 1130 with heme emanating from retinal flap tear  - significant central vitreous  hemorrhage obscuring vision -- improving slowly  - s/p cryo retinopexy OD (12.18.20) -- good cryo changes  - VH improving with BCVA stable at 20/20 OD  - VH precautions reviewed -- minimize activities, keep head elevated, avoid ASA/NSAIDs/blood thinners as able  - f/u 4 weeks  3. History of Hemorrhagic PVD OD             -  acutely symptomatic central floaters, onset Wednesday 8.12.20             - pt spoke to PCP on 08.13.20 who set up consult w/ Dr. Alver Fisher on 08.14.20             - pt reports boxing trauma to OD ~2 wks prior to initial onset of symptoms             - VH precautions reviewed -- minimize activities, keep head elevated, avoid ASA/NSAIDs/blood thinners as able  4. Mixed form age related cataract OU  - The symptoms of cataract, surgical options, and treatments and risks were discussed with patient.  - discussed diagnosis and progression  - not yet visually significant  - monitor for now  5. History of Myopia with Astigmatism  6. Ocular hypertension OD   - IOP 41 at initial (01.07.21) visit -- IOP okay at 08 today  - likely steroid response - pt used PF beyond the 7 days post op as instructed   - dec Cosopt to qdaily OD   Ophthalmic Meds Ordered this visit:  No orders of the defined types were placed in this encounter.      Return in about 4 weeks (around 11/11/2019) for f/u VH OD, DFE, OCT.  There are no Patient Instructions on file for this visit.   Explained the diagnoses, plan, and follow up with the patient and they expressed understanding.  Patient expressed understanding of the importance of proper follow up care.   This document serves as a record of services personally performed by Karie Chimera, MD, PhD. It was created on their behalf by Herby Abraham, COA, a certified ophthalmic assistant. The creation of this record is the provider's dictation and/or activities during the visit.    Electronically signed by: Herby Abraham, COA @TODAY @ 4:32 PM    This document serves as a record of services personally performed by , MD, PhD. It was created on their behalf by Karie Chimera, OA, an ophthalmic assistant. The creation of this record is the provider's dictation and/or activities during the visit.    Electronically signed by: Laurian Brim, OA 04.05.2021 4:32 PM   06.05.2021, M.D., Ph.D. Diseases & Surgery of the Retina and Vitreous [facility]  I have reviewed the above documentation for accuracy and completeness, and I agree with the above. [facility], M.D., Ph.D. 10/14/19 4:32 PM    Abbreviations: M myopia (nearsighted); A astigmatism; H hyperopia (farsighted); P presbyopia; Mrx spectacle prescription;  CTL contact lenses; OD right eye; OS left eye; OU both eyes  XT exotropia; ET esotropia; PEK punctate epithelial keratitis; PEE punctate epithelial erosions; DES dry eye syndrome; MGD meibomian gland dysfunction; ATs artificial tears; PFAT's preservative free artificial tears; NSC nuclear sclerotic cataract; PSC posterior subcapsular cataract; ERM epi-retinal membrane; PVD posterior vitreous detachment; RD retinal detachment; DM diabetes mellitus; DR diabetic retinopathy; NPDR non-proliferative diabetic retinopathy; PDR proliferative diabetic retinopathy; CSME clinically significant macular edema; DME diabetic macular edema; dbh dot blot hemorrhages; CWS cotton wool spot; POAG primary open angle glaucoma; C/D cup-to-disc ratio; HVF humphrey visual field; GVF goldmann visual field; OCT optical coherence tomography; IOP intraocular pressure; BRVO Branch retinal vein occlusion; CRVO central retinal vein occlusion; CRAO central retinal artery occlusion; BRAO branch retinal artery occlusion; RT retinal tear; SB scleral buckle; PPV pars plana vitrectomy; VH Vitreous hemorrhage; PRP panretinal laser photocoagulation; IVK intravitreal kenalog; VMT vitreomacular traction; MH Macular hole;  NVD  neovascularization of the disc; NVE neovascularization elsewhere; AREDS age related eye disease study; ARMD age related macular degeneration; POAG primary open angle glaucoma; EBMD epithelial/anterior basement membrane dystrophy; ACIOL anterior chamber intraocular lens; IOL intraocular lens; PCIOL posterior chamber intraocular lens; Phaco/IOL phacoemulsification with intraocular lens placement; [place] photorefractive keratectomy; LASIK laser assisted in situ keratomileusis; HTN hypertension; DM diabetes mellitus; COPD chronic obstructive pulmonary disease

## 2019-10-14 ENCOUNTER — Other Ambulatory Visit: Payer: Self-pay

## 2019-10-14 ENCOUNTER — Ambulatory Visit (INDEPENDENT_AMBULATORY_CARE_PROVIDER_SITE_OTHER): Payer: BC Managed Care – PPO | Admitting: Ophthalmology

## 2019-10-14 ENCOUNTER — Encounter (INDEPENDENT_AMBULATORY_CARE_PROVIDER_SITE_OTHER): Payer: Self-pay | Admitting: Ophthalmology

## 2019-10-14 DIAGNOSIS — H52203 Unspecified astigmatism, bilateral: Secondary | ICD-10-CM

## 2019-10-14 DIAGNOSIS — H40051 Ocular hypertension, right eye: Secondary | ICD-10-CM

## 2019-10-14 DIAGNOSIS — H5213 Myopia, bilateral: Secondary | ICD-10-CM

## 2019-10-14 DIAGNOSIS — H3581 Retinal edema: Secondary | ICD-10-CM

## 2019-10-14 DIAGNOSIS — H33311 Horseshoe tear of retina without detachment, right eye: Secondary | ICD-10-CM | POA: Diagnosis not present

## 2019-10-14 DIAGNOSIS — H25813 Combined forms of age-related cataract, bilateral: Secondary | ICD-10-CM | POA: Diagnosis not present

## 2019-10-14 DIAGNOSIS — H43811 Vitreous degeneration, right eye: Secondary | ICD-10-CM | POA: Diagnosis not present

## 2019-10-14 DIAGNOSIS — H4311 Vitreous hemorrhage, right eye: Secondary | ICD-10-CM

## 2019-10-16 DIAGNOSIS — F913 Oppositional defiant disorder: Secondary | ICD-10-CM | POA: Diagnosis not present

## 2019-11-07 DIAGNOSIS — Z20828 Contact with and (suspected) exposure to other viral communicable diseases: Secondary | ICD-10-CM | POA: Diagnosis not present

## 2019-11-11 ENCOUNTER — Encounter (INDEPENDENT_AMBULATORY_CARE_PROVIDER_SITE_OTHER): Payer: BC Managed Care – PPO | Admitting: Ophthalmology

## 2019-11-29 NOTE — Progress Notes (Signed)
[facility] Note  12/02/2019     CHIEF COMPLAINT Patient presents for Retina Follow Up   HISTORY OF PRESENT ILLNESS: Jonathan Yang is a 54 y.o. male who presents to the clinic today for:   HPI    Retina Follow Up    Patient presents with  Retinal Break/Detachment.  In right eye.  Duration of 7 weeks.  Since onset it is gradually improving.  I, the attending physician,  performed the HPI with the patient and updated documentation appropriately.          Comments    7 week follow up- OD vision is still blurry but gradually improving since last visit.  No new problems OU, denies FOLs or new floaters.         Last edited by Bernarda Caffey, MD on 12/02/2019  9:09 AM. (History)    Pt    Referring physician: London Pepper, MD [address],   [id number]  HISTORICAL INFORMATION:   Selected notes from the [facility] Referred by Dr. Martinique DeMarco for concern of vitreous hemorrhage / retinal tear OD LEE: 08.14.20 (J. DeMarco) Ocular Hx- PMH-   CURRENT MEDICATIONS: No current outpatient medications on file. (Ophthalmic Drugs)   No current facility-administered medications for this visit. (Ophthalmic Drugs)   No current outpatient medications on file. (Other)   No current facility-administered medications for this visit. (Other)      REVIEW OF SYSTEMS: ROS    Positive for: Eyes   Negative for: Constitutional, Gastrointestinal, Neurological, Skin, Genitourinary, Musculoskeletal, HENT, Endocrine, Cardiovascular, Respiratory, Psychiatric, Allergic/Imm, Heme/Lymph   Last edited by Leonie Douglas, COA on 12/02/2019  8:39 AM. (History)       ALLERGIES No Known Allergies  PAST MEDICAL HISTORY Past Medical History:  Diagnosis Date  . Cataract    OU   History reviewed. No pertinent surgical history.  FAMILY HISTORY History reviewed. No pertinent family history.  SOCIAL HISTORY Social History    Tobacco Use  . Smoking status: Not on file  Substance Use Topics  . Alcohol use: Not on file  . Drug use: Not on file         OPHTHALMIC EXAM:  Base Eye Exam    Visual Acuity (Snellen - Linear)      Right Left   Dist cc 20/25 20/20   Correction: Glasses       Tonometry (Tonopen, 8:44 AM)      Right Left   Pressure 14 17       Pupils      Dark Light Shape React APD   Right 3 2 Round Brisk None   Left 3 2 Round Brisk None       Visual Fields (Counting fingers)      Left Right    Full Full       Extraocular Movement      Right Left    Full Full       Neuro/Psych    Oriented x3: Yes   Mood/Affect: Normal       Dilation    Both eyes: 1.0% Mydriacyl, 2.5% Phenylephrine @ 8:44 AM        Slit Lamp and Fundus Exam    Slit Lamp Exam      Right Left   Lids/Lashes Dermatochalasis - upper lid, Meibomian gland dysfunction Dermatochalasis - upper lid, Meibomian gland dysfunction   Conjunctiva/Sclera Focal Subconjunctival hemorrhage IT quad White and quiet  Cornea trace Arcus, 1+ Punctate epithelial erosions, Debris in tear film trace Arcus, 1+ Punctate epithelial erosions   Anterior Chamber Deep and quiet Deep and quiet   Iris Round and dilated Round and dilated   Lens 2+ Nuclear sclerosis, 2+ Cortical cataract 2+ Nuclear sclerosis, 2+ Cortical cataract   Vitreous Vitreous syneresis, white blood clots settled inferiorly and clearing, 1-2+ diffuse RBC/pigment in anterior vitreous; VH thinning centrally and settling inferiorly Vitreous syneresis       Fundus Exam      Right Left   Disc Compact, pink, Sharp rim Pink and Sharp   C/D Ratio 0.2 0.2   Macula Flat, good foveal reflex, RPE mottling and clumping, No heme or edema Flat, Blunted foveal reflex, mild Retinal pigment epithelial mottling, No heme or edema   Vessels mild Vascular attenuation Vascular attenuation   Periphery hazy view inferiorly, attached elsewhere, blood clot IT periphery turning white, HST  at 1130 -- good laser and cryo changes surrounding Attached, mild pigmented lattice at 0700        Refraction    Wearing Rx      Sphere Cylinder Axis Add   Right -4.25 +1.50 165 +2.50   Left -6.50 +3.00 090 +2.50          IMAGING AND PROCEDURES  Imaging and Procedures for @TODAY @  OCT, Retina - OU - Both Eyes       Right Eye Quality was good. Central Foveal Thickness: 288. Progression has improved. Findings include normal foveal contour, no IRF, no SRF (Mild interval improvement in vitreous opacities ).   Left Eye Quality was good. Central Foveal Thickness: 277. Progression has been stable. Findings include normal foveal contour, no IRF, no SRF, vitreomacular adhesion .   Notes *Images captured and stored on drive  Diagnosis / Impression:  OD: NFP, no IRF/SRF -- Mild interval improvement in vitreous opacities  OS: NFP, no IRF/SRF   Clinical management:  See below  Abbreviations: NFP - Normal foveal profile. CME - cystoid macular edema. PED - pigment epithelial detachment. IRF - intraretinal fluid. SRF - subretinal fluid. EZ - ellipsoid zone. ERM - epiretinal membrane. ORA - outer retinal atrophy. ORT - outer retinal tubulation. SRHM - subretinal hyper-reflective material                 ASSESSMENT/PLAN:    ICD-10-CM   1. Retinal tear of right eye  H33.311   2. Vitreous hemorrhage of right eye (HCC)  H43.11   3. Posterior vitreous detachment of right eye  H43.811   4. Combined forms of age-related cataract of both eyes  H25.813   5. Myopia of both eyes with astigmatism  H52.13    H52.203   6. Ocular hypertension of right eye  H40.051   7. Retinal edema  H35.81 OCT, Retina - OU - Both Eyes   1,2. Retinal tear with vitreous hemorrhage, right eye  - HST located at 1130 with heme emanating from retinal flap tear  - originally significant central vitreous hemorrhage obscuring vision -- improving very slowly  - s/p cryo retinopexy OD (12.18.20) -- good cryo  changes  - VH improving with BCVA stable at 20/25 OD  - VH precautions reviewed -- minimize activities, keep head elevated, avoid ASA/NSAIDs/blood thinners as able  - f/u 8 weeks  3. History of Hemorrhagic PVD OD             - acutely symptomatic central floaters, onset Wednesday 8.12.20             -  pt spoke to PCP on 08.13.20 who set up consult w/ Dr. Alver Fisher on 08.14.20             - pt reports boxing trauma to OD ~2 wks prior to initial onset of symptoms             - VH precautions reviewed -- minimize activities, keep head elevated, avoid ASA/NSAIDs/blood thinners as able  4. Mixed form age related cataract OU  - The symptoms of cataract, surgical options, and treatments and risks were discussed with patient.  - discussed diagnosis and progression  - not yet visually significant  - monitor for now  5. History of Myopia with Astigmatism  6. Ocular hypertension OD   - IOP 41 at initial (01.07.21) visit  - likely steroid response - pt used PF beyond the 7 days post op as instructed  - IOP 14 today -- okay to stop Cosopt OD   Ophthalmic Meds Ordered this visit:  No orders of the defined types were placed in this encounter.      Return in about 8 weeks (around 01/27/2020) for f/u retinal tear / vitreous hemorrhage OD, DFE, OCT.  There are no Patient Instructions on file for this visit.   Explained the diagnoses, plan, and follow up with the patient and they expressed understanding.  Patient expressed understanding of the importance of proper follow up care.    This document serves as a record of services personally performed by Karie Chimera, MD, PhD. It was created on their behalf by Laurian Brim, OA, an ophthalmic assistant. The creation of this record is the provider's dictation and/or activities during the visit.    Electronically signed by: Laurian Brim, OA 05.24.2021 9:43 PM  Karie Chimera, M.D., Ph.D. Diseases & Surgery of the Retina and Vitreous [facility] 12/02/2019   I have reviewed the above documentation for accuracy and completeness, and I agree with the above. Karie Chimera, M.D., Ph.D. 12/02/19 9:43 PM    Abbreviations: M myopia (nearsighted); A astigmatism; H hyperopia (farsighted); P presbyopia; Mrx spectacle prescription;  CTL contact lenses; OD right eye; OS left eye; OU both eyes  XT exotropia; ET esotropia; PEK punctate epithelial keratitis; PEE punctate epithelial erosions; DES dry eye syndrome; MGD meibomian gland dysfunction; ATs artificial tears; PFAT's preservative free artificial tears; NSC nuclear sclerotic cataract; PSC posterior subcapsular cataract; ERM epi-retinal membrane; PVD posterior vitreous detachment; RD retinal detachment; DM diabetes mellitus; DR diabetic retinopathy; NPDR non-proliferative diabetic retinopathy; PDR proliferative diabetic retinopathy; CSME clinically significant macular edema; DME diabetic macular edema; dbh dot blot hemorrhages; CWS cotton wool spot; POAG primary open angle glaucoma; C/D cup-to-disc ratio; HVF humphrey visual field; GVF goldmann visual field; OCT optical coherence tomography; IOP intraocular pressure; BRVO Branch retinal vein occlusion; CRVO central retinal vein occlusion; CRAO central retinal artery occlusion; BRAO branch retinal artery occlusion; RT retinal tear; SB scleral buckle; PPV pars plana vitrectomy; VH Vitreous hemorrhage; PRP panretinal laser photocoagulation; IVK intravitreal kenalog; VMT vitreomacular traction; MH Macular hole;  NVD neovascularization of the disc; NVE neovascularization elsewhere; AREDS age related eye disease study; ARMD age related macular degeneration; POAG primary open angle glaucoma; EBMD epithelial/anterior basement membrane dystrophy; ACIOL anterior chamber intraocular lens; IOL intraocular lens; PCIOL posterior chamber intraocular lens; Phaco/IOL phacoemulsification with intraocular lens placement; PRK photorefractive  keratectomy; LASIK laser assisted in situ keratomileusis; HTN hypertension; DM diabetes mellitus; COPD chronic obstructive pulmonary disease

## 2019-12-02 ENCOUNTER — Other Ambulatory Visit: Payer: Self-pay

## 2019-12-02 ENCOUNTER — Ambulatory Visit (INDEPENDENT_AMBULATORY_CARE_PROVIDER_SITE_OTHER): Payer: BC Managed Care – PPO | Admitting: Ophthalmology

## 2019-12-02 ENCOUNTER — Encounter (INDEPENDENT_AMBULATORY_CARE_PROVIDER_SITE_OTHER): Payer: Self-pay | Admitting: Ophthalmology

## 2019-12-02 DIAGNOSIS — H40051 Ocular hypertension, right eye: Secondary | ICD-10-CM

## 2019-12-02 DIAGNOSIS — H4311 Vitreous hemorrhage, right eye: Secondary | ICD-10-CM

## 2019-12-02 DIAGNOSIS — H3581 Retinal edema: Secondary | ICD-10-CM

## 2019-12-02 DIAGNOSIS — H43811 Vitreous degeneration, right eye: Secondary | ICD-10-CM

## 2019-12-02 DIAGNOSIS — H33311 Horseshoe tear of retina without detachment, right eye: Secondary | ICD-10-CM

## 2019-12-02 DIAGNOSIS — H25813 Combined forms of age-related cataract, bilateral: Secondary | ICD-10-CM | POA: Diagnosis not present

## 2019-12-02 DIAGNOSIS — H52203 Unspecified astigmatism, bilateral: Secondary | ICD-10-CM

## 2019-12-02 DIAGNOSIS — H5213 Myopia, bilateral: Secondary | ICD-10-CM

## 2019-12-12 DIAGNOSIS — M109 Gout, unspecified: Secondary | ICD-10-CM | POA: Diagnosis not present

## 2019-12-12 DIAGNOSIS — Z720 Tobacco use: Secondary | ICD-10-CM | POA: Diagnosis not present

## 2019-12-12 DIAGNOSIS — L237 Allergic contact dermatitis due to plants, except food: Secondary | ICD-10-CM | POA: Diagnosis not present

## 2020-01-22 NOTE — Progress Notes (Signed)
[facility] - Clinic Note  01/27/2020     CHIEF COMPLAINT Patient presents for Retina Follow Up   HISTORY OF PRESENT ILLNESS: Jonathan Yang is a 54 y.o. male who presents to the clinic today for:   HPI    Retina Follow Up    Patient presents with  Retinal Break/Detachment.  In right eye.  This started months ago.  Severity is mild.  Duration of 2 months.  Since onset it is gradually improving.  I, the attending physician,  performed the HPI with the patient and updated documentation appropriately.          Comments    54 y/o male pt here for 2 mo f/u for HST w/VH OD.  S/p cryo retinopexy OD 12.18.20.  VA OD gradually improving.  No change in [place] OS.  Denies pain, FOL, floaters.  AT prn OU.       Last edited by Rennis Chris, MD on 01/27/2020  9:29 AM. (History)    Pt    Referring physician: Farris Has, MD [address] [place],  [place] [address]  HISTORICAL INFORMATION:   Selected notes from the MEDICAL RECORD NUMBER Referred by Dr. [place] DeMarco for concern of vitreous hemorrhage / retinal tear OD LEE: 08.14.20 (J. DeMarco) Ocular Hx- PMH-   CURRENT MEDICATIONS: No current outpatient medications on file. (Ophthalmic Drugs)   No current facility-administered medications for this visit. (Ophthalmic Drugs)   Current Outpatient Medications (Other)  Medication Sig  . allopurinol (ZYLOPRIM) 100 MG tablet Take 100 mg by mouth daily.  . predniSONE (DELTASONE) 10 MG tablet Take by mouth.   No current facility-administered medications for this visit. (Other)      REVIEW OF SYSTEMS: ROS    Positive for: Eyes   Negative for: Constitutional, Gastrointestinal, Neurological, Skin, Genitourinary, Musculoskeletal, HENT, Endocrine, Cardiovascular, Respiratory, Psychiatric, Allergic/Imm, Heme/Lymph   Last edited by Celine Mans, COA on 01/27/2020  9:01 AM. (History)       ALLERGIES No Known Allergies  PAST MEDICAL HISTORY Past  Medical History:  Diagnosis Date  . Cataract    OU   History reviewed. No pertinent surgical history.  FAMILY HISTORY History reviewed. No pertinent family history.  SOCIAL HISTORY Social History   Tobacco Use  . Smoking status: Not on file  Substance Use Topics  . Alcohol use: Not on file  . Drug use: Not on file         OPHTHALMIC EXAM:  Base Eye Exam    Visual Acuity (Snellen - Linear)      Right Left   Dist cc 20/20 - 20/20   Correction: Glasses       Tonometry (Tonopen, 9:03 AM)      Right Left   Pressure 15 15       Pupils      Dark Light Shape React APD   Right 3 2 Round Brisk None   Left 3 2 Round Brisk None       Visual Fields (Counting fingers)      Left Right    Full Full       Extraocular Movement      Right Left    Full, Ortho Full, Ortho       Neuro/Psych    Oriented x3: Yes   Mood/Affect: Normal       Dilation    Both eyes: 1.0% Mydriacyl, 2.5% Phenylephrine @ 9:03 AM  Slit Lamp and Fundus Exam    Slit Lamp Exam      Right Left   Lids/Lashes Dermatochalasis - upper lid, Meibomian gland dysfunction Dermatochalasis - upper lid, Meibomian gland dysfunction   Conjunctiva/Sclera Focal Subconjunctival hemorrhage IT quad White and quiet   Cornea trace Arcus, trace Punctate epithelial erosions trace Arcus, trace Punctate epithelial erosions   Anterior Chamber Deep and quiet Deep and quiet   Iris Round and dilated Round and dilated   Lens 2+ Nuclear sclerosis, 2+ Cortical cataract 2+ Nuclear sclerosis, 2+ Cortical cataract   Vitreous Vitreous syneresis, white blood clots settled inferiorly and clearing, VH thinning centrally and settling inferiorly Vitreous syneresis       Fundus Exam      Right Left   Disc Compact, pink, Sharp rim Pink and Sharp   C/D Ratio 0.2 0.2   Macula Flat, good foveal reflex, RPE mottling and clumping, No heme or edema Flat, Blunted foveal reflex, mild Retinal pigment epithelial mottling, No heme or  edema   Vessels mild Vascular attenuation Vascular attenuation   Periphery hazy view inferiorly, attached elsewhere, blood clot IT periphery turning white, HST at 1130 -- good laser and cryo changes surrounding Attached, mild pigmented lattice at 0700          IMAGING AND PROCEDURES  Imaging and Procedures for @TODAY @  OCT, Retina - OU - Both Eyes       Right Eye Quality was good. Central Foveal Thickness: 279. Progression has improved. Findings include normal foveal contour, no IRF, no SRF (interval improvement in vitreous opacities ).   Left Eye Quality was good. Central Foveal Thickness: 275. Progression has been stable. Findings include normal foveal contour, no IRF, no SRF, vitreomacular adhesion .   Notes *Images captured and stored on drive  Diagnosis / Impression:  OD: NFP, no IRF/SRF -- interval improvement in vitreous opacities  OS: NFP, no IRF/SRF   Clinical management:  See below  Abbreviations: NFP - Normal foveal profile. CME - cystoid macular edema. PED - pigment epithelial detachment. IRF - intraretinal fluid. SRF - subretinal fluid. EZ - ellipsoid zone. ERM - epiretinal membrane. ORA - outer retinal atrophy. ORT - outer retinal tubulation. SRHM - subretinal hyper-reflective material                 ASSESSMENT/PLAN:    ICD-10-CM   1. Retinal tear of right eye  H33.311   2. Vitreous hemorrhage of right eye (HCC)  H43.11   3. Posterior vitreous detachment of right eye  H43.811   4. Combined forms of age-related cataract of both eyes  H25.813   5. Myopia of both eyes with astigmatism  H52.13    H52.203   6. Ocular hypertension of right eye  H40.051   7. Retinal edema  H35.81 OCT, Retina - OU - Both Eyes   1,2. Retinal tear with vitreous hemorrhage, right eye  - HST located at 1130 with heme emanating from retinal flap tear  - originally significant central vitreous hemorrhage obscuring vision -- improving very slowly  - s/p cryo retinopexy OD  (12.18.20) -- good cryo changes  - no new RT / RD  - VH improving with BCVA improved to 20/20 from 20/25 OD  - VH precautions reviewed -- minimize activities, keep head elevated, avoid ASA/NSAIDs/blood thinners as able  - f/u 6 months, DFE, OCT -- sooner prn  3. History of Hemorrhagic PVD OD             -  acutely symptomatic central floaters, onset Wednesday 8.12.20             - pt spoke to PCP on 08.13.20 who set up consult w/ Dr. Alver Fisher on 08.14.20             - pt reports boxing trauma to OD ~2 wks prior to initial onset of symptoms             - VH precautions reviewed -- minimize activities, keep head elevated, avoid ASA/NSAIDs/blood thinners as able  4. Mixed form age related cataract OU  - The symptoms of cataract, surgical options, and treatments and risks were discussed with patient.  - discussed diagnosis and progression  - not yet visually significant  - monitor for now  5. History of Myopia with Astigmatism  6. Ocular hypertension OD   - IOP 41 at initial (01.07.21) visit  - likely steroid response - pt used PF beyond the 7 days post op as instructed  - IOP 15 today off of Cosopt   Ophthalmic Meds Ordered this visit:  No orders of the defined types were placed in this encounter.      Return in about 6 months (around 07/29/2020) for f/u retinal tear OD, DFE, OCT.  There are no Patient Instructions on file for this visit.   Explained the diagnoses, plan, and follow up with the patient and they expressed understanding.  Patient expressed understanding of the importance of proper follow up care.    This document serves as a record of services personally performed by Karie Chimera, MD, PhD. It was created on their behalf by Joni Reining, an ophthalmic technician. The creation of this record is the provider's dictation and/or activities during the visit.    Electronically signed by: Joni Reining COA, 01/27/20  4:35 PM   This document serves as a record of  services personally performed by Karie Chimera, MD, PhD. It was created on their behalf by Glee Arvin. Manson Passey, OA an ophthalmic technician. The creation of this record is the provider's dictation and/or activities during the visit.    Electronically signed by: Glee Arvin. Kristopher Oppenheim 07.19.2021 4:35 PM   Karie Chimera, M.D., Ph.D. Diseases & Surgery of the Retina and Vitreous [facility]  I have reviewed the above documentation for accuracy and completeness, and I agree with the above. Karie Chimera, M.D., Ph.D. 01/27/20 4:35 PM   Abbreviations: M myopia (nearsighted); A astigmatism; H hyperopia (farsighted); P presbyopia; Mrx spectacle prescription;  CTL contact lenses; OD right eye; OS left eye; OU both eyes  XT exotropia; ET esotropia; PEK punctate epithelial keratitis; PEE punctate epithelial erosions; DES dry eye syndrome; MGD meibomian gland dysfunction; ATs artificial tears; PFAT's preservative free artificial tears; NSC nuclear sclerotic cataract; PSC posterior subcapsular cataract; ERM epi-retinal membrane; PVD posterior vitreous detachment; RD retinal detachment; DM diabetes mellitus; DR diabetic retinopathy; NPDR non-proliferative diabetic retinopathy; PDR proliferative diabetic retinopathy; CSME clinically significant macular edema; DME diabetic macular edema; dbh dot blot hemorrhages; CWS cotton wool spot; POAG primary open angle glaucoma; C/D cup-to-disc ratio; HVF humphrey visual field; GVF goldmann visual field; OCT optical coherence tomography; IOP intraocular pressure; BRVO Branch retinal vein occlusion; CRVO central retinal vein occlusion; CRAO central retinal artery occlusion; BRAO branch retinal artery occlusion; RT retinal tear; SB scleral buckle; PPV pars plana vitrectomy; VH Vitreous hemorrhage; PRP panretinal laser photocoagulation; IVK intravitreal kenalog; VMT vitreomacular traction; MH Macular hole;  NVD neovascularization of the disc;  NVE  neovascularization elsewhere; AREDS age related eye disease study; ARMD age related macular degeneration; POAG primary open angle glaucoma; EBMD epithelial/anterior basement membrane dystrophy; ACIOL anterior chamber intraocular lens; IOL intraocular lens; PCIOL posterior chamber intraocular lens; Phaco/IOL phacoemulsification with intraocular lens placement; [place] photorefractive keratectomy; LASIK laser assisted in situ keratomileusis; HTN hypertension; DM diabetes mellitus; COPD chronic obstructive pulmonary disease

## 2020-01-27 ENCOUNTER — Ambulatory Visit (INDEPENDENT_AMBULATORY_CARE_PROVIDER_SITE_OTHER): Payer: BC Managed Care – PPO | Admitting: Ophthalmology

## 2020-01-27 ENCOUNTER — Encounter (INDEPENDENT_AMBULATORY_CARE_PROVIDER_SITE_OTHER): Payer: Self-pay | Admitting: Ophthalmology

## 2020-01-27 ENCOUNTER — Other Ambulatory Visit: Payer: Self-pay

## 2020-01-27 DIAGNOSIS — H43811 Vitreous degeneration, right eye: Secondary | ICD-10-CM

## 2020-01-27 DIAGNOSIS — H25813 Combined forms of age-related cataract, bilateral: Secondary | ICD-10-CM

## 2020-01-27 DIAGNOSIS — H33311 Horseshoe tear of retina without detachment, right eye: Secondary | ICD-10-CM | POA: Diagnosis not present

## 2020-01-27 DIAGNOSIS — H3581 Retinal edema: Secondary | ICD-10-CM | POA: Diagnosis not present

## 2020-01-27 DIAGNOSIS — H4311 Vitreous hemorrhage, right eye: Secondary | ICD-10-CM | POA: Diagnosis not present

## 2020-01-27 DIAGNOSIS — H40051 Ocular hypertension, right eye: Secondary | ICD-10-CM

## 2020-01-27 DIAGNOSIS — H52203 Unspecified astigmatism, bilateral: Secondary | ICD-10-CM

## 2020-01-27 DIAGNOSIS — H5213 Myopia, bilateral: Secondary | ICD-10-CM

## 2020-02-14 DIAGNOSIS — E785 Hyperlipidemia, unspecified: Secondary | ICD-10-CM | POA: Diagnosis not present

## 2020-02-14 DIAGNOSIS — R7309 Other abnormal glucose: Secondary | ICD-10-CM | POA: Diagnosis not present

## 2020-02-14 DIAGNOSIS — Z125 Encounter for screening for malignant neoplasm of prostate: Secondary | ICD-10-CM | POA: Diagnosis not present

## 2020-02-14 DIAGNOSIS — M109 Gout, unspecified: Secondary | ICD-10-CM | POA: Diagnosis not present

## 2020-02-14 DIAGNOSIS — Z23 Encounter for immunization: Secondary | ICD-10-CM | POA: Diagnosis not present

## 2020-02-14 DIAGNOSIS — Z0000 Encounter for general adult medical examination without abnormal findings: Secondary | ICD-10-CM | POA: Diagnosis not present

## 2020-04-17 DIAGNOSIS — Z23 Encounter for immunization: Secondary | ICD-10-CM | POA: Diagnosis not present

## 2020-07-28 NOTE — Progress Notes (Addendum)
[facility] Note  07/29/2020     CHIEF COMPLAINT Patient presents for Retina Follow Up   HISTORY OF PRESENT ILLNESS: Jonathan Yang is a 55 y.o. male who presents to the clinic today for:   HPI    Retina Follow Up    Patient presents with  Retinal Break/Detachment.  In right eye.  This started months ago.  Severity is mild.  Duration of 6 months.  Since onset it is stable.  I, the attending physician,  performed the HPI with the patient and updated documentation appropriately.          Comments    55 y/o male pt here for 6 mo f/u for ret tear OD.  S/p cryo retinopexy OD 12.18.20.  No change in [place] OU.  Denies pain, FOL.  Rarely sees a brief "shadow" temporally OD.  No gtts.       Last edited by Bernarda Caffey, MD on 07/29/2020  9:21 AM. (History)    Pt states floaters have improved   Referring physician: [place], [place], [place],  [place] [address]  HISTORICAL INFORMATION:   Selected notes from the [place] Referred by Dr. [place] DeMarco for concern of vitreous hemorrhage / retinal tear OD LEE: 08.14.20 (J. DeMarco) Ocular Hx- PMH-   CURRENT MEDICATIONS: No current outpatient medications on file. (Ophthalmic Drugs)   No current facility-administered medications for this visit. (Ophthalmic Drugs)   Current Outpatient Medications (Other)  Medication Sig  . allopurinol (ZYLOPRIM) 100 MG tablet Take 100 mg by mouth daily.  Marland Kitchen buPROPion (WELLBUTRIN SR) 150 MG 12 hr tablet 1 tablet  . predniSONE (DELTASONE) 10 MG tablet Take by mouth.   No current facility-administered medications for this visit. (Other)      REVIEW OF SYSTEMS: ROS    Positive for: Eyes   Negative for: Constitutional, Gastrointestinal, Neurological, Skin, Genitourinary, Musculoskeletal, HENT, Endocrine, Cardiovascular, Respiratory, Psychiatric, Allergic/Imm, Heme/Lymph   Last edited by Matthew Folks, COA on 07/29/2020  8:42 AM. (History)        ALLERGIES No Known Allergies  PAST MEDICAL HISTORY Past Medical History:  Diagnosis Date  . Cataract    OU   History reviewed. No pertinent surgical history.  FAMILY HISTORY History reviewed. No pertinent family history.  SOCIAL HISTORY         OPHTHALMIC EXAM:  Base Eye Exam    Visual Acuity (Snellen - Linear)      Right Left   Dist cc 20/20 20/20   Correction: Glasses       Tonometry (Tonopen, 8:45 AM)      Right Left   Pressure 16 15       Pupils      Dark Light Shape React APD   Right 3 2 Round Brisk None   Left 3 2 Round Brisk None       Visual Fields (Counting fingers)      Left Right    Full Full       Extraocular Movement      Right Left    Full, Ortho Full, Ortho       Neuro/Psych    Oriented x3: Yes   Mood/Affect: Normal       Dilation    Both eyes: 1.0% Mydriacyl, 2.5% Phenylephrine @ 8:45 AM        Slit Lamp and Fundus Exam    Slit Lamp Exam      Right Left  Lids/Lashes Dermatochalasis - upper lid, Meibomian gland dysfunction Dermatochalasis - upper lid, Meibomian gland dysfunction   Conjunctiva/Sclera White and quiet White and quiet   Cornea trace Arcus, trace Punctate epithelial erosions trace Arcus, trace Punctate epithelial erosions   Anterior Chamber Deep and quiet Deep and quiet   Iris Round and dilated Round and dilated   Lens 2+ Nuclear sclerosis, 2+ Cortical cataract 2+ Nuclear sclerosis, 2+ Cortical cataract   Vitreous Vitreous syneresis, old white blood clots settled inferiorly Vitreous syneresis       Fundus Exam      Right Left   Disc Compact, pink, Sharp rim Pink and Sharp, Compact   C/D Ratio 0.2 0.2   Macula Flat, good foveal reflex, RPE mottling and clumping, No heme or edema Flat, good foveal reflex, mild Retinal pigment epithelial mottling, No heme or edema   Vessels attenuated, mild tortuousity Vascular attenuation   Periphery Attached, old white blood clot IT periphery, HST at 1130 -- good laser  and cryo changes surrounding, No new RT/RD Attached, focal pigmented paving stone IN quad, No new RT/RD          IMAGING AND PROCEDURES  Imaging and Procedures for $RemoveBefore'@TODAY'[email or web address]$ @  OCT, Retina - OU - Both Eyes       Right Eye Quality was good. Central Foveal Thickness: 278. Progression has improved. Findings include normal foveal contour, no IRF, no SRF (Trace vitreous opacities -- improved from prior).   Left Eye Quality was good. Central Foveal Thickness: 276. Progression has been stable. Findings include normal foveal contour, no IRF, no SRF, vitreomacular adhesion .   Notes *Images captured and stored on drive  Diagnosis / Impression:  OD: NFP, no IRF/SRF -- Trace vitreous opacities -- improved from prior OS: NFP, no IRF/SRF   Clinical management:  See below  Abbreviations: NFP - Normal foveal profile. CME - cystoid macular edema. PED - pigment epithelial detachment. IRF - intraretinal fluid. SRF - subretinal fluid. EZ - ellipsoid zone. ERM - epiretinal membrane. ORA - outer retinal atrophy. ORT - outer retinal tubulation. SRHM - subretinal hyper-reflective material                 ASSESSMENT/PLAN:    ICD-10-CM   1. Retinal tear of right eye  H33.311   2. Vitreous hemorrhage of right eye ([place])  H43.11   3. Posterior vitreous detachment of right eye  H43.811   4. Combined forms of age-related cataract of both eyes  H25.813   5. Myopia of both eyes with astigmatism  H52.13    H52.203   6. Ocular hypertension of right eye  H40.051   7. Retinal edema  H35.81 OCT, Retina - OU - Both Eyes   1,2. Retinal tear with vitreous hemorrhage, right eye  - HST located at 1130 with heme emanating from retinal flap tear  - originally significant central vitreous hemorrhage obscuring vision -- improved  - s/p cryo retinopexy OD (12.18.20) -- good cryo changes  - no new RT / RD  - BCVA stable at 20/20 OD  - minimal residual old, white VH settled inferiorly  - pt is cleared from a  retina standpoint for release to [facility] and resumption of primary eye care  - pt can f/u here prn  3. History of Hemorrhagic PVD OD             - acutely symptomatic central floaters, onset Wednesday 8.12.20             -  pt spoke to PCP on 08.13.20 who set up consult w/ Dr. Wonda Horner on 08.14.20             - pt reports boxing trauma to OD ~2 wks prior to initial onset of symptoms  4. Mixed form age related cataract OU  - The symptoms of cataract, surgical options, and treatments and risks were discussed with patient.  - discussed diagnosis and progression  - not yet visually significant  - monitor for now  5. History of Myopia with Astigmatism  6. Ocular hypertension OD   - IOP 41 at initial (01.07.21) visit  - likely steroid response - pt used PF beyond the 7 days post op as instructed  - IOP 16 today off of cosopt   Ophthalmic Meds Ordered this visit:  No orders of the defined types were placed in this encounter.      Return if symptoms worsen or fail to improve.  There are no Patient Instructions on file for this visit.   Explained the diagnoses, plan, and follow up with the patient and they expressed understanding.  Patient expressed understanding of the importance of proper follow up care.    This document serves as a record of services personally performed by Gardiner Sleeper, MD, PhD. It was created on their behalf by Roselee Nova, COMT. The creation of this record is the provider's dictation and/or activities during the visit.  Electronically signed by: Roselee Nova, COMT 07/29/20 9:26 AM   This document serves as a record of services personally performed by Gardiner Sleeper, MD, PhD. It was created on their behalf by San Jetty. Owens Shark, OA an ophthalmic technician. The creation of this record is the provider's dictation and/or activities during the visit.    Electronically signed by: San Jetty. Owens Shark, [place] 01.19.2022 9:26 AM  Gardiner Sleeper, M.D.,  Ph.D. Diseases & Surgery of the Retina and Vitreous [facility]  I have reviewed the above documentation for accuracy and completeness, and I agree with the above. Gardiner Sleeper, M.D., Ph.D. 07/29/20 9:26 AM   Abbreviations: M myopia (nearsighted); A astigmatism; H hyperopia (farsighted); P presbyopia; Mrx spectacle prescription;  CTL contact lenses; OD right eye; OS left eye; OU both eyes  XT exotropia; ET esotropia; PEK punctate epithelial keratitis; PEE punctate epithelial erosions; DES dry eye syndrome; MGD meibomian gland dysfunction; ATs artificial tears; PFAT's preservative free artificial tears; [place] nuclear sclerotic cataract; PSC posterior subcapsular cataract; ERM epi-retinal membrane; PVD posterior vitreous detachment; RD retinal detachment; DM diabetes mellitus; DR diabetic retinopathy; NPDR non-proliferative diabetic retinopathy; PDR proliferative diabetic retinopathy; CSME clinically significant macular edema; DME diabetic macular edema; dbh dot blot hemorrhages; CWS cotton wool spot; POAG primary open angle glaucoma; C/D cup-to-disc ratio; HVF humphrey visual field; GVF goldmann visual field; OCT optical coherence tomography; IOP intraocular pressure; BRVO Branch retinal vein occlusion; CRVO central retinal vein occlusion; CRAO central retinal artery occlusion; BRAO branch retinal artery occlusion; RT retinal tear; SB scleral buckle; PPV pars plana vitrectomy; VH Vitreous hemorrhage; PRP panretinal laser photocoagulation; IVK intravitreal kenalog; VMT vitreomacular traction; MH Macular hole;  NVD neovascularization of the disc; NVE neovascularization elsewhere; AREDS age related eye disease study; ARMD age related macular degeneration; POAG primary open angle glaucoma; EBMD epithelial/anterior basement membrane dystrophy; ACIOL anterior chamber intraocular lens; IOL intraocular lens; PCIOL posterior chamber intraocular lens; Phaco/IOL phacoemulsification with  intraocular lens placement; [place] photorefractive keratectomy; LASIK laser assisted in situ keratomileusis; HTN hypertension; DM diabetes mellitus; COPD  chronic obstructive pulmonary disease  

## 2020-07-29 ENCOUNTER — Ambulatory Visit (INDEPENDENT_AMBULATORY_CARE_PROVIDER_SITE_OTHER): Payer: BC Managed Care – PPO | Admitting: Ophthalmology

## 2020-07-29 ENCOUNTER — Other Ambulatory Visit: Payer: Self-pay

## 2020-07-29 ENCOUNTER — Encounter (INDEPENDENT_AMBULATORY_CARE_PROVIDER_SITE_OTHER): Payer: Self-pay | Admitting: Ophthalmology

## 2020-07-29 DIAGNOSIS — H4311 Vitreous hemorrhage, right eye: Secondary | ICD-10-CM

## 2020-07-29 DIAGNOSIS — H43811 Vitreous degeneration, right eye: Secondary | ICD-10-CM | POA: Diagnosis not present

## 2020-07-29 DIAGNOSIS — H40051 Ocular hypertension, right eye: Secondary | ICD-10-CM

## 2020-07-29 DIAGNOSIS — H5213 Myopia, bilateral: Secondary | ICD-10-CM

## 2020-07-29 DIAGNOSIS — H52203 Unspecified astigmatism, bilateral: Secondary | ICD-10-CM

## 2020-07-29 DIAGNOSIS — H3581 Retinal edema: Secondary | ICD-10-CM | POA: Diagnosis not present

## 2020-07-29 DIAGNOSIS — H25813 Combined forms of age-related cataract, bilateral: Secondary | ICD-10-CM | POA: Diagnosis not present

## 2020-07-29 DIAGNOSIS — H33311 Horseshoe tear of retina without detachment, right eye: Secondary | ICD-10-CM | POA: Diagnosis not present

## 2021-02-26 DIAGNOSIS — M109 Gout, unspecified: Secondary | ICD-10-CM | POA: Diagnosis not present

## 2021-02-26 DIAGNOSIS — R109 Unspecified abdominal pain: Secondary | ICD-10-CM | POA: Diagnosis not present

## 2021-02-26 DIAGNOSIS — R7309 Other abnormal glucose: Secondary | ICD-10-CM | POA: Diagnosis not present

## 2021-02-26 DIAGNOSIS — Z0000 Encounter for general adult medical examination without abnormal findings: Secondary | ICD-10-CM | POA: Diagnosis not present

## 2021-02-26 DIAGNOSIS — R7401 Elevation of levels of liver transaminase levels: Secondary | ICD-10-CM | POA: Diagnosis not present

## 2021-02-26 DIAGNOSIS — E785 Hyperlipidemia, unspecified: Secondary | ICD-10-CM | POA: Diagnosis not present

## 2021-02-26 DIAGNOSIS — Z125 Encounter for screening for malignant neoplasm of prostate: Secondary | ICD-10-CM | POA: Diagnosis not present

## 2021-04-27 ENCOUNTER — Encounter: Payer: BC Managed Care – PPO | Attending: Family Medicine | Admitting: Dietician

## 2021-04-28 DIAGNOSIS — D485 Neoplasm of uncertain behavior of skin: Secondary | ICD-10-CM | POA: Diagnosis not present

## 2021-04-28 DIAGNOSIS — B079 Viral wart, unspecified: Secondary | ICD-10-CM | POA: Diagnosis not present

## 2021-04-28 DIAGNOSIS — D1724 Benign lipomatous neoplasm of skin and subcutaneous tissue of left leg: Secondary | ICD-10-CM | POA: Diagnosis not present

## 2021-10-07 DIAGNOSIS — E785 Hyperlipidemia, unspecified: Secondary | ICD-10-CM | POA: Diagnosis not present

## 2021-10-07 DIAGNOSIS — M109 Gout, unspecified: Secondary | ICD-10-CM | POA: Diagnosis not present

## 2021-10-07 DIAGNOSIS — E1169 Type 2 diabetes mellitus with other specified complication: Secondary | ICD-10-CM | POA: Diagnosis not present

## 2021-10-11 DIAGNOSIS — E1169 Type 2 diabetes mellitus with other specified complication: Secondary | ICD-10-CM | POA: Diagnosis not present

## 2021-12-20 DIAGNOSIS — M533 Sacrococcygeal disorders, not elsewhere classified: Secondary | ICD-10-CM | POA: Diagnosis not present

## 2021-12-24 DIAGNOSIS — L255 Unspecified contact dermatitis due to plants, except food: Secondary | ICD-10-CM | POA: Diagnosis not present

## 2022-01-28 DIAGNOSIS — R945 Abnormal results of liver function studies: Secondary | ICD-10-CM | POA: Diagnosis not present

## 2022-01-28 DIAGNOSIS — E1169 Type 2 diabetes mellitus with other specified complication: Secondary | ICD-10-CM | POA: Diagnosis not present

## 2022-01-28 DIAGNOSIS — M109 Gout, unspecified: Secondary | ICD-10-CM | POA: Diagnosis not present

## 2022-01-28 DIAGNOSIS — E785 Hyperlipidemia, unspecified: Secondary | ICD-10-CM | POA: Diagnosis not present

## 2022-01-28 DIAGNOSIS — Z125 Encounter for screening for malignant neoplasm of prostate: Secondary | ICD-10-CM | POA: Diagnosis not present

## 2022-03-18 DIAGNOSIS — R6883 Chills (without fever): Secondary | ICD-10-CM | POA: Diagnosis not present

## 2022-03-18 DIAGNOSIS — Z03818 Encounter for observation for suspected exposure to other biological agents ruled out: Secondary | ICD-10-CM | POA: Diagnosis not present

## 2022-03-18 DIAGNOSIS — R0981 Nasal congestion: Secondary | ICD-10-CM | POA: Diagnosis not present

## 2022-03-18 DIAGNOSIS — R059 Cough, unspecified: Secondary | ICD-10-CM | POA: Diagnosis not present

## 2022-03-18 DIAGNOSIS — J029 Acute pharyngitis, unspecified: Secondary | ICD-10-CM | POA: Diagnosis not present

## 2022-03-18 DIAGNOSIS — R52 Pain, unspecified: Secondary | ICD-10-CM | POA: Diagnosis not present

## 2022-10-11 DIAGNOSIS — H00012 Hordeolum externum right lower eyelid: Secondary | ICD-10-CM | POA: Diagnosis not present

## 2022-12-08 DIAGNOSIS — R748 Abnormal levels of other serum enzymes: Secondary | ICD-10-CM | POA: Diagnosis not present

## 2022-12-08 DIAGNOSIS — E1169 Type 2 diabetes mellitus with other specified complication: Secondary | ICD-10-CM | POA: Diagnosis not present

## 2022-12-08 DIAGNOSIS — M109 Gout, unspecified: Secondary | ICD-10-CM | POA: Diagnosis not present

## 2022-12-08 DIAGNOSIS — R030 Elevated blood-pressure reading, without diagnosis of hypertension: Secondary | ICD-10-CM | POA: Diagnosis not present

## 2023-03-13 DIAGNOSIS — R52 Pain, unspecified: Secondary | ICD-10-CM | POA: Diagnosis not present

## 2023-03-13 DIAGNOSIS — R059 Cough, unspecified: Secondary | ICD-10-CM | POA: Diagnosis not present

## 2023-03-13 DIAGNOSIS — R5383 Other fatigue: Secondary | ICD-10-CM | POA: Diagnosis not present

## 2023-03-13 DIAGNOSIS — U071 COVID-19: Secondary | ICD-10-CM | POA: Diagnosis not present

## 2023-09-09 DIAGNOSIS — J069 Acute upper respiratory infection, unspecified: Secondary | ICD-10-CM | POA: Diagnosis not present

## 2023-09-09 DIAGNOSIS — R6883 Chills (without fever): Secondary | ICD-10-CM | POA: Diagnosis not present

## 2023-09-09 DIAGNOSIS — R051 Acute cough: Secondary | ICD-10-CM | POA: Diagnosis not present

## 2024-02-06 DIAGNOSIS — R030 Elevated blood-pressure reading, without diagnosis of hypertension: Secondary | ICD-10-CM | POA: Diagnosis not present

## 2024-02-06 DIAGNOSIS — E785 Hyperlipidemia, unspecified: Secondary | ICD-10-CM | POA: Diagnosis not present

## 2024-02-06 DIAGNOSIS — Z125 Encounter for screening for malignant neoplasm of prostate: Secondary | ICD-10-CM | POA: Diagnosis not present

## 2024-02-06 DIAGNOSIS — E1169 Type 2 diabetes mellitus with other specified complication: Secondary | ICD-10-CM | POA: Diagnosis not present

## 2024-02-06 DIAGNOSIS — M109 Gout, unspecified: Secondary | ICD-10-CM | POA: Diagnosis not present
